# Patient Record
Sex: Female | Born: 1935 | Race: Black or African American | Hispanic: No | Smoking: Former smoker
Health system: Southern US, Community
[De-identification: ages and names within clinical notes are randomized; demographics above are authoritative.]

## PROBLEM LIST (undated history)

## (undated) DIAGNOSIS — R200 Anesthesia of skin: Secondary | ICD-10-CM

## (undated) DIAGNOSIS — S5291XA Unspecified fracture of right forearm, initial encounter for closed fracture: Secondary | ICD-10-CM

## (undated) DIAGNOSIS — F32A Depression, unspecified: Secondary | ICD-10-CM

## (undated) DIAGNOSIS — K811 Chronic cholecystitis: Secondary | ICD-10-CM

## (undated) DIAGNOSIS — J449 Chronic obstructive pulmonary disease, unspecified: Secondary | ICD-10-CM

## (undated) DIAGNOSIS — K7581 Nonalcoholic steatohepatitis (NASH): Secondary | ICD-10-CM

## (undated) DIAGNOSIS — D509 Iron deficiency anemia, unspecified: Secondary | ICD-10-CM

## (undated) DIAGNOSIS — N2889 Other specified disorders of kidney and ureter: Secondary | ICD-10-CM

## (undated) DIAGNOSIS — I10 Essential (primary) hypertension: Secondary | ICD-10-CM

## (undated) DIAGNOSIS — C73 Malignant neoplasm of thyroid gland: Secondary | ICD-10-CM

## (undated) DIAGNOSIS — F329 Major depressive disorder, single episode, unspecified: Secondary | ICD-10-CM

## (undated) DIAGNOSIS — R300 Dysuria: Secondary | ICD-10-CM

## (undated) DIAGNOSIS — E785 Hyperlipidemia, unspecified: Secondary | ICD-10-CM

## (undated) DIAGNOSIS — E039 Hypothyroidism, unspecified: Secondary | ICD-10-CM

## (undated) HISTORY — DX: Unspecified fracture of right forearm, initial encounter for closed fracture: S52.91XA

## (undated) HISTORY — DX: Iron deficiency anemia, unspecified: D50.9

## (undated) HISTORY — DX: Dysuria: R30.0

## (undated) HISTORY — PX: THYROIDECTOMY: SHX17

## (undated) HISTORY — DX: Nonalcoholic steatohepatitis (NASH): K75.81

## (undated) HISTORY — DX: Other specified disorders of kidney and ureter: N28.89

## (undated) HISTORY — DX: Essential (primary) hypertension: I10

## (undated) HISTORY — PX: EYE SURGERY: SHX253

## (undated) HISTORY — DX: Major depressive disorder, single episode, unspecified: F32.9

## (undated) HISTORY — DX: Chronic cholecystitis: K81.1

## (undated) HISTORY — PX: OTHER SURGICAL HISTORY: SHX169

## (undated) HISTORY — DX: Depression, unspecified: F32.A

## (undated) HISTORY — DX: Chronic obstructive pulmonary disease, unspecified: J44.9

## (undated) HISTORY — DX: Hyperlipidemia, unspecified: E78.5

## (undated) HISTORY — PX: VESICOVAGINAL FISTULA CLOSURE W/ TAH: SUR271

## (undated) HISTORY — DX: Hypothyroidism, unspecified: E03.9

## (undated) HISTORY — PX: BACK SURGERY: SHX140

## (undated) HISTORY — DX: Anesthesia of skin: R20.0

## (undated) HISTORY — PX: SPINE SURGERY: SHX786

## (undated) HISTORY — DX: Malignant neoplasm of thyroid gland: C73

---

## 2001-03-23 ENCOUNTER — Other Ambulatory Visit: Admission: RE | Admit: 2001-03-23 | Discharge: 2001-03-23 | Payer: Self-pay | Admitting: Family Medicine

## 2001-08-24 ENCOUNTER — Encounter: Payer: Self-pay | Admitting: Family Medicine

## 2001-08-24 ENCOUNTER — Ambulatory Visit (HOSPITAL_COMMUNITY): Admission: RE | Admit: 2001-08-24 | Discharge: 2001-08-24 | Payer: Self-pay | Admitting: Family Medicine

## 2002-02-15 ENCOUNTER — Encounter: Payer: Self-pay | Admitting: Preventative Medicine

## 2002-02-15 ENCOUNTER — Ambulatory Visit (HOSPITAL_COMMUNITY): Admission: RE | Admit: 2002-02-15 | Discharge: 2002-02-15 | Payer: Self-pay | Admitting: Preventative Medicine

## 2004-02-29 ENCOUNTER — Ambulatory Visit (HOSPITAL_COMMUNITY): Admission: RE | Admit: 2004-02-29 | Discharge: 2004-02-29 | Payer: Self-pay | Admitting: *Deleted

## 2004-07-26 ENCOUNTER — Ambulatory Visit (HOSPITAL_COMMUNITY): Admission: RE | Admit: 2004-07-26 | Discharge: 2004-07-26 | Payer: Self-pay | Admitting: Family Medicine

## 2004-08-08 ENCOUNTER — Ambulatory Visit (HOSPITAL_COMMUNITY): Admission: RE | Admit: 2004-08-08 | Discharge: 2004-08-08 | Payer: Self-pay | Admitting: Family Medicine

## 2004-08-13 ENCOUNTER — Ambulatory Visit (HOSPITAL_COMMUNITY): Admission: RE | Admit: 2004-08-13 | Discharge: 2004-08-13 | Payer: Self-pay | Admitting: Family Medicine

## 2004-09-27 ENCOUNTER — Ambulatory Visit: Payer: Self-pay | Admitting: Family Medicine

## 2004-11-27 ENCOUNTER — Ambulatory Visit: Payer: Self-pay | Admitting: Family Medicine

## 2005-01-08 ENCOUNTER — Ambulatory Visit: Payer: Self-pay | Admitting: Family Medicine

## 2005-04-08 ENCOUNTER — Ambulatory Visit: Payer: Self-pay | Admitting: Family Medicine

## 2005-04-10 ENCOUNTER — Ambulatory Visit (HOSPITAL_COMMUNITY): Admission: RE | Admit: 2005-04-10 | Discharge: 2005-04-10 | Payer: Self-pay | Admitting: Family Medicine

## 2005-07-15 ENCOUNTER — Ambulatory Visit: Payer: Self-pay | Admitting: Family Medicine

## 2005-07-28 ENCOUNTER — Ambulatory Visit (HOSPITAL_COMMUNITY): Admission: RE | Admit: 2005-07-28 | Discharge: 2005-07-28 | Payer: Self-pay | Admitting: Family Medicine

## 2005-08-20 ENCOUNTER — Ambulatory Visit (HOSPITAL_COMMUNITY): Admission: RE | Admit: 2005-08-20 | Discharge: 2005-08-20 | Payer: Self-pay | Admitting: Family Medicine

## 2005-08-20 LAB — HM MAMMOGRAPHY: HM Mammogram: NORMAL

## 2005-09-16 ENCOUNTER — Ambulatory Visit: Payer: Self-pay | Admitting: Family Medicine

## 2005-09-16 LAB — CONVERTED CEMR LAB: Pap Smear: NORMAL

## 2005-11-19 ENCOUNTER — Ambulatory Visit: Payer: Self-pay | Admitting: Family Medicine

## 2006-03-19 ENCOUNTER — Ambulatory Visit: Payer: Self-pay | Admitting: Family Medicine

## 2006-07-24 ENCOUNTER — Ambulatory Visit: Payer: Self-pay | Admitting: Family Medicine

## 2006-08-25 ENCOUNTER — Ambulatory Visit: Payer: Self-pay | Admitting: Family Medicine

## 2006-11-27 ENCOUNTER — Ambulatory Visit: Payer: Self-pay | Admitting: Family Medicine

## 2006-11-27 LAB — CONVERTED CEMR LAB
Albumin: 4.4 g/dL (ref 3.5–5.2)
Alkaline Phosphatase: 172 units/L — ABNORMAL HIGH (ref 39–117)
Bilirubin, Direct: 0.1 mg/dL (ref 0.0–0.3)
CO2: 26 meq/L (ref 19–32)
Chloride: 103 meq/L (ref 96–112)
Creatinine, Ser: 1.06 mg/dL (ref 0.40–1.20)
Glucose, Bld: 91 mg/dL (ref 70–99)
HDL: 82 mg/dL (ref >39)
LDL Cholesterol: 140 mg/dL — ABNORMAL HIGH (ref 0–99)
TSH: 0.338 microintl units/mL — ABNORMAL LOW (ref 0.350–5.50)
Total Bilirubin: 0.5 mg/dL (ref 0.3–1.2)
Total CHOL/HDL Ratio: 3

## 2007-03-10 ENCOUNTER — Emergency Department (HOSPITAL_COMMUNITY): Admission: EM | Admit: 2007-03-10 | Discharge: 2007-03-10 | Payer: Self-pay | Admitting: Emergency Medicine

## 2007-03-10 ENCOUNTER — Ambulatory Visit: Payer: Self-pay | Admitting: Family Medicine

## 2007-03-17 ENCOUNTER — Ambulatory Visit: Payer: Self-pay | Admitting: Family Medicine

## 2007-03-17 LAB — CONVERTED CEMR LAB
HDL: 60 mg/dL (ref >39)
TSH: 0.697 microintl units/mL (ref 0.350–5.50)
Triglycerides: 98 mg/dL (ref <150)

## 2007-06-03 ENCOUNTER — Ambulatory Visit: Payer: Self-pay | Admitting: Family Medicine

## 2007-06-03 ENCOUNTER — Ambulatory Visit (HOSPITAL_COMMUNITY): Admission: RE | Admit: 2007-06-03 | Discharge: 2007-06-03 | Payer: Self-pay | Admitting: Family Medicine

## 2007-06-03 LAB — CONVERTED CEMR LAB
Leukocytes, UA: NEGATIVE
Nitrite: NEGATIVE
Specific Gravity, Urine: 1.025
Urobilinogen, UA: 0.2
pH: 5.5

## 2007-06-04 ENCOUNTER — Encounter: Payer: Self-pay | Admitting: Family Medicine

## 2007-06-04 LAB — CONVERTED CEMR LAB
Leukocytes, UA: NEGATIVE
Nitrite: NEGATIVE
Protein, ur: NEGATIVE mg/dL
Urine Glucose: NEGATIVE mg/dL
Urobilinogen, UA: 0.2 (ref 0.0–1.0)

## 2007-06-10 ENCOUNTER — Ambulatory Visit: Payer: Self-pay | Admitting: Orthopedic Surgery

## 2007-07-12 ENCOUNTER — Ambulatory Visit: Payer: Self-pay | Admitting: Orthopedic Surgery

## 2007-09-17 ENCOUNTER — Ambulatory Visit: Payer: Self-pay | Admitting: Family Medicine

## 2007-11-26 ENCOUNTER — Encounter: Payer: Self-pay | Admitting: Family Medicine

## 2007-11-26 DIAGNOSIS — K811 Chronic cholecystitis: Secondary | ICD-10-CM

## 2007-11-26 DIAGNOSIS — Z87891 Personal history of nicotine dependence: Secondary | ICD-10-CM | POA: Insufficient documentation

## 2007-11-26 DIAGNOSIS — E785 Hyperlipidemia, unspecified: Secondary | ICD-10-CM | POA: Insufficient documentation

## 2007-11-26 DIAGNOSIS — D509 Iron deficiency anemia, unspecified: Secondary | ICD-10-CM

## 2007-11-26 DIAGNOSIS — S42309A Unspecified fracture of shaft of humerus, unspecified arm, initial encounter for closed fracture: Secondary | ICD-10-CM | POA: Insufficient documentation

## 2007-11-26 DIAGNOSIS — E039 Hypothyroidism, unspecified: Secondary | ICD-10-CM

## 2007-11-26 DIAGNOSIS — D376 Neoplasm of uncertain behavior of liver, gallbladder and bile ducts: Secondary | ICD-10-CM | POA: Insufficient documentation

## 2007-11-26 DIAGNOSIS — K7689 Other specified diseases of liver: Secondary | ICD-10-CM | POA: Insufficient documentation

## 2007-11-26 DIAGNOSIS — I10 Essential (primary) hypertension: Secondary | ICD-10-CM

## 2008-02-15 ENCOUNTER — Ambulatory Visit: Payer: Self-pay | Admitting: Family Medicine

## 2008-02-15 LAB — CONVERTED CEMR LAB
Albumin: 4.8 g/dL (ref 3.5–5.2)
Alkaline Phosphatase: 181 units/L — ABNORMAL HIGH (ref 39–117)
BUN: 15 mg/dL (ref 6–23)
CO2: 25 meq/L (ref 19–32)
Chloride: 105 meq/L (ref 96–112)
Creatinine, Ser: 0.91 mg/dL (ref 0.40–1.20)
HDL: 85 mg/dL (ref >39)
LDL Cholesterol: 123 mg/dL — ABNORMAL HIGH (ref 0–99)
Potassium: 4.1 meq/L (ref 3.5–5.3)
Total Bilirubin: 0.4 mg/dL (ref 0.3–1.2)
Total Protein: 7.8 g/dL (ref 6.0–8.3)
VLDL: 28 mg/dL (ref 0–40)

## 2008-02-16 ENCOUNTER — Encounter: Payer: Self-pay | Admitting: Family Medicine

## 2008-02-16 LAB — CONVERTED CEMR LAB
Hep B C IgM: NEGATIVE
Hepatitis B Surface Ag: NEGATIVE

## 2008-06-28 ENCOUNTER — Ambulatory Visit: Payer: Self-pay | Admitting: Family Medicine

## 2008-06-28 LAB — CONVERTED CEMR LAB
ALT: 42 units/L — ABNORMAL HIGH (ref 0–35)
BUN: 23 mg/dL (ref 6–23)
CO2: 24 meq/L (ref 19–32)
Chloride: 106 meq/L (ref 96–112)
Creatinine, Ser: 1.04 mg/dL (ref 0.40–1.20)
Glucose, Bld: 95 mg/dL (ref 70–99)
Hemoglobin: 12 g/dL (ref 12.0–15.0)
Lymphocytes Relative: 24 % (ref 12–46)
MCHC: 32.2 g/dL (ref 30.0–36.0)
Monocytes Absolute: 0.3 10*3/uL (ref 0.1–1.0)
Monocytes Relative: 8 % (ref 3–12)
Neutro Abs: 2.5 10*3/uL (ref 1.7–7.7)
RBC: 3.7 M/uL — ABNORMAL LOW (ref 3.87–5.11)
TSH: 0.577 microintl units/mL (ref 0.350–4.50)
Total Protein: 7.7 g/dL (ref 6.0–8.3)

## 2008-06-29 DIAGNOSIS — G4700 Insomnia, unspecified: Secondary | ICD-10-CM | POA: Insufficient documentation

## 2008-08-21 ENCOUNTER — Telehealth: Payer: Self-pay | Admitting: Family Medicine

## 2008-08-21 ENCOUNTER — Encounter: Payer: Self-pay | Admitting: Family Medicine

## 2008-09-14 ENCOUNTER — Encounter: Payer: Self-pay | Admitting: Family Medicine

## 2008-09-21 ENCOUNTER — Encounter: Payer: Self-pay | Admitting: Family Medicine

## 2008-10-31 ENCOUNTER — Ambulatory Visit: Payer: Self-pay | Admitting: Family Medicine

## 2008-11-02 LAB — CONVERTED CEMR LAB
AST: 33 units/L (ref 0–37)
Alkaline Phosphatase: 312 units/L — ABNORMAL HIGH (ref 39–117)
Bilirubin, Direct: 0.1 mg/dL (ref 0.0–0.3)
CO2: 21 meq/L (ref 19–32)
Calcium: 11.4 mg/dL — ABNORMAL HIGH (ref 8.4–10.5)
Creatinine, Ser: 1.04 mg/dL (ref 0.40–1.20)
Glucose, Bld: 103 mg/dL — ABNORMAL HIGH (ref 70–99)
HDL: 76 mg/dL (ref >39)
Indirect Bilirubin: 0.4 mg/dL (ref 0.0–0.9)
Potassium: 4.5 meq/L (ref 3.5–5.3)
Sodium: 142 meq/L (ref 135–145)
TSH: 1.435 microintl units/mL (ref 0.350–4.50)
Total Bilirubin: 0.5 mg/dL (ref 0.3–1.2)

## 2008-12-11 ENCOUNTER — Telehealth: Payer: Self-pay | Admitting: Family Medicine

## 2008-12-19 ENCOUNTER — Telehealth: Payer: Self-pay | Admitting: Family Medicine

## 2008-12-20 ENCOUNTER — Encounter: Payer: Self-pay | Admitting: Family Medicine

## 2009-02-18 ENCOUNTER — Emergency Department (HOSPITAL_COMMUNITY): Admission: EM | Admit: 2009-02-18 | Discharge: 2009-02-18 | Payer: Self-pay | Admitting: Emergency Medicine

## 2009-02-19 ENCOUNTER — Encounter: Payer: Self-pay | Admitting: Family Medicine

## 2009-02-19 ENCOUNTER — Telehealth: Payer: Self-pay | Admitting: Family Medicine

## 2009-02-28 ENCOUNTER — Encounter: Payer: Self-pay | Admitting: Family Medicine

## 2009-03-08 ENCOUNTER — Ambulatory Visit: Payer: Self-pay | Admitting: Family Medicine

## 2009-03-08 DIAGNOSIS — M79609 Pain in unspecified limb: Secondary | ICD-10-CM | POA: Insufficient documentation

## 2009-03-08 DIAGNOSIS — M542 Cervicalgia: Secondary | ICD-10-CM

## 2009-03-08 LAB — CONVERTED CEMR LAB
CO2: 26 meq/L (ref 19–32)
Chloride: 103 meq/L (ref 96–112)
Cholesterol: 291 mg/dL — ABNORMAL HIGH (ref 0–200)
Glucose, Bld: 94 mg/dL (ref 70–99)
LDL Cholesterol: 181 mg/dL — ABNORMAL HIGH (ref 0–99)
Potassium: 4.1 meq/L (ref 3.5–5.3)
Sodium: 140 meq/L (ref 135–145)
TSH: 1.334 microintl units/mL (ref 0.350–4.500)
Total CHOL/HDL Ratio: 4.2
VLDL: 40 mg/dL (ref 0–40)

## 2009-03-14 ENCOUNTER — Telehealth: Payer: Self-pay | Admitting: Family Medicine

## 2009-04-09 ENCOUNTER — Telehealth: Payer: Self-pay | Admitting: Family Medicine

## 2009-04-17 ENCOUNTER — Telehealth: Payer: Self-pay | Admitting: Family Medicine

## 2009-04-27 ENCOUNTER — Telehealth: Payer: Self-pay | Admitting: Family Medicine

## 2009-04-30 ENCOUNTER — Encounter: Payer: Self-pay | Admitting: Family Medicine

## 2009-06-18 ENCOUNTER — Telehealth: Payer: Self-pay | Admitting: Family Medicine

## 2009-07-09 ENCOUNTER — Ambulatory Visit: Payer: Self-pay | Admitting: Family Medicine

## 2009-07-10 DIAGNOSIS — H6120 Impacted cerumen, unspecified ear: Secondary | ICD-10-CM

## 2009-07-24 ENCOUNTER — Telehealth: Payer: Self-pay | Admitting: Family Medicine

## 2009-07-31 ENCOUNTER — Telehealth: Payer: Self-pay | Admitting: Family Medicine

## 2009-08-16 ENCOUNTER — Encounter: Payer: Self-pay | Admitting: Family Medicine

## 2009-10-31 ENCOUNTER — Ambulatory Visit: Payer: Self-pay | Admitting: Family Medicine

## 2009-11-02 LAB — CONVERTED CEMR LAB
Alkaline Phosphatase: 132 units/L — ABNORMAL HIGH (ref 39–117)
Basophils Relative: 1 % (ref 0–1)
CO2: 25 meq/L (ref 19–32)
Calcium: 11.1 mg/dL — ABNORMAL HIGH (ref 8.4–10.5)
Cholesterol: 310 mg/dL — ABNORMAL HIGH (ref 0–200)
Creatinine, Ser: 0.95 mg/dL (ref 0.40–1.20)
HDL: 79 mg/dL (ref >39)
Hemoglobin: 13.3 g/dL (ref 12.0–15.0)
Indirect Bilirubin: 0.4 mg/dL (ref 0.0–0.9)
LDL Cholesterol: 202 mg/dL — ABNORMAL HIGH (ref 0–99)
Lymphs Abs: 1 10*3/uL (ref 0.7–4.0)
MCHC: 32.8 g/dL (ref 30.0–36.0)
MCV: 98.3 fL (ref 78.0–100.0)
Monocytes Relative: 8 % (ref 3–12)
RBC: 4.13 M/uL (ref 3.87–5.11)
RDW: 12.9 % (ref 11.5–15.5)
Sodium: 141 meq/L (ref 135–145)
Total Protein: 8 g/dL (ref 6.0–8.3)
Triglycerides: 146 mg/dL (ref <150)

## 2009-11-14 ENCOUNTER — Telehealth: Payer: Self-pay | Admitting: Family Medicine

## 2009-12-17 ENCOUNTER — Telehealth: Payer: Self-pay | Admitting: Family Medicine

## 2010-02-11 ENCOUNTER — Telehealth: Payer: Self-pay | Admitting: Family Medicine

## 2010-03-01 ENCOUNTER — Ambulatory Visit: Payer: Self-pay | Admitting: Family Medicine

## 2010-03-01 DIAGNOSIS — M109 Gout, unspecified: Secondary | ICD-10-CM

## 2010-03-01 DIAGNOSIS — K3189 Other diseases of stomach and duodenum: Secondary | ICD-10-CM

## 2010-03-01 DIAGNOSIS — M25579 Pain in unspecified ankle and joints of unspecified foot: Secondary | ICD-10-CM

## 2010-03-01 DIAGNOSIS — R1013 Epigastric pain: Secondary | ICD-10-CM

## 2010-03-05 LAB — CONVERTED CEMR LAB
ALT: 32 units/L (ref 0–35)
AST: 38 units/L — ABNORMAL HIGH (ref 0–37)
Albumin: 4.8 g/dL (ref 3.5–5.2)
Alkaline Phosphatase: 153 units/L — ABNORMAL HIGH (ref 39–117)
CO2: 24 meq/L (ref 19–32)
Calcium: 10.5 mg/dL (ref 8.4–10.5)
HDL: 81 mg/dL (ref >39)
Sodium: 141 meq/L (ref 135–145)
TSH: 0.339 microintl units/mL — ABNORMAL LOW (ref 0.350–4.500)
Total Bilirubin: 0.6 mg/dL (ref 0.3–1.2)
Total CHOL/HDL Ratio: 3.2
Total Protein: 7.8 g/dL (ref 6.0–8.3)
Triglycerides: 93 mg/dL (ref <150)
Uric Acid, Serum: 7.5 mg/dL — ABNORMAL HIGH (ref 2.4–7.0)

## 2010-04-05 ENCOUNTER — Telehealth: Payer: Self-pay | Admitting: Family Medicine

## 2010-07-05 ENCOUNTER — Ambulatory Visit: Payer: Self-pay | Admitting: Family Medicine

## 2010-07-05 DIAGNOSIS — R5381 Other malaise: Secondary | ICD-10-CM

## 2010-07-05 DIAGNOSIS — R5383 Other fatigue: Secondary | ICD-10-CM | POA: Insufficient documentation

## 2010-07-05 DIAGNOSIS — R42 Dizziness and giddiness: Secondary | ICD-10-CM | POA: Insufficient documentation

## 2010-07-08 ENCOUNTER — Telehealth: Payer: Self-pay | Admitting: Family Medicine

## 2010-07-10 LAB — CONVERTED CEMR LAB
ALT: 16 units/L (ref 0–35)
Alkaline Phosphatase: 137 units/L — ABNORMAL HIGH (ref 39–117)
BUN: 23 mg/dL (ref 6–23)
Basophils Relative: 1 % (ref 0–1)
Calcium: 11.1 mg/dL — ABNORMAL HIGH (ref 8.4–10.5)
Cholesterol: 238 mg/dL — ABNORMAL HIGH (ref 0–200)
Creatinine, Ser: 1.18 mg/dL (ref 0.40–1.20)
Eosinophils Absolute: 0.3 10*3/uL (ref 0.0–0.7)
HCT: 40.6 % (ref 36.0–46.0)
Hemoglobin: 13.2 g/dL (ref 12.0–15.0)
Indirect Bilirubin: 0.6 mg/dL (ref 0.0–0.9)
Lymphs Abs: 1 10*3/uL (ref 0.7–4.0)
MCHC: 32.5 g/dL (ref 30.0–36.0)
MCV: 101.2 fL — ABNORMAL HIGH (ref 78.0–100.0)
Monocytes Absolute: 0.3 10*3/uL (ref 0.1–1.0)
Monocytes Relative: 8 % (ref 3–12)
Neutrophils Relative %: 52 % (ref 43–77)
Potassium: 4 meq/L (ref 3.5–5.3)
RBC: 4.01 M/uL (ref 3.87–5.11)
Total Protein: 7.6 g/dL (ref 6.0–8.3)
Triglycerides: 87 mg/dL (ref <150)
WBC: 3.1 10*3/uL — ABNORMAL LOW (ref 4.0–10.5)

## 2010-09-16 ENCOUNTER — Telehealth: Payer: Self-pay | Admitting: Family Medicine

## 2010-09-18 ENCOUNTER — Encounter: Payer: Self-pay | Admitting: Family Medicine

## 2010-10-04 ENCOUNTER — Telehealth: Payer: Self-pay | Admitting: Family Medicine

## 2010-10-07 ENCOUNTER — Telehealth: Payer: Self-pay | Admitting: Family Medicine

## 2010-10-08 ENCOUNTER — Telehealth: Payer: Self-pay | Admitting: Family Medicine

## 2010-10-09 ENCOUNTER — Ambulatory Visit (HOSPITAL_COMMUNITY)
Admission: RE | Admit: 2010-10-09 | Discharge: 2010-10-09 | Payer: Self-pay | Source: Home / Self Care | Admitting: Ophthalmology

## 2010-10-18 ENCOUNTER — Ambulatory Visit: Payer: Self-pay | Admitting: Family Medicine

## 2010-10-18 DIAGNOSIS — N3000 Acute cystitis without hematuria: Secondary | ICD-10-CM | POA: Insufficient documentation

## 2010-10-19 LAB — CONVERTED CEMR LAB: TSH: 1.053 microintl units/mL (ref 0.350–4.500)

## 2010-11-28 ENCOUNTER — Encounter: Payer: Self-pay | Admitting: Family Medicine

## 2010-12-07 ENCOUNTER — Encounter: Payer: Self-pay | Admitting: Family Medicine

## 2010-12-08 ENCOUNTER — Encounter: Payer: Self-pay | Admitting: *Deleted

## 2010-12-09 ENCOUNTER — Ambulatory Visit (HOSPITAL_COMMUNITY): Admission: RE | Admit: 2010-12-09 | Payer: Self-pay | Source: Home / Self Care | Admitting: Ophthalmology

## 2010-12-09 ENCOUNTER — Ambulatory Visit (HOSPITAL_COMMUNITY)
Admission: RE | Admit: 2010-12-09 | Discharge: 2010-12-09 | Payer: Self-pay | Source: Home / Self Care | Attending: Ophthalmology | Admitting: Ophthalmology

## 2010-12-17 NOTE — Letter (Signed)
Summary: FMLA PAPER  FMLA PAPER   Imported By: Lind Guest 09/18/2010 15:04:25  _____________________________________________________________________  External Attachment:    Type:   Image     Comment:   External Document

## 2010-12-17 NOTE — Assessment & Plan Note (Signed)
Summary: f up   Vital Signs:  Patient profile:   77 year old female Menstrual status:  hysterectomy Height:      65 inches Weight:      127 pounds BMI:     21.21 O2 Sat:      98 % Pulse rate:   58 / minute Pulse rhythm:   regular Resp:     16 per minute BP sitting:   130 / 74  (left arm) BP standing:   130 / 74  (left arm) Cuff size:   regular  Vitals Entered By: Everitt Amber LPN (July 05, 2010 8:40 AM) CC: Follow up chronic problems   Primary Care Provider:  Syliva Overman MD  CC:  Follow up chronic problems.  History of Present Illness: Reports  thatl she has been dpoing fairly well. She does note however, soime early satiety and she has also lost weight.  She denies any change in bowel movements. She still has had no colonscopy , and wants to still put this off. She has unfortunately not been taking the thyroid meds as prescribed, she has been taking an excessive amt, and this may well be a part of the cause of her weight loss.  Denies recent fever or chills. Denies sinus pressure, nasal congestion , ear pain or sore throat. Denies chest congestion, or cough productive of sputum. Denies chest pain, palpitations, PND, orthopnea or leg swelling. Denies abdominal pain, nausea, vomitting, diarrhea or constipation.  Denies dysuria , frequency, incontinence or hesitancy. Denies  joint pain, swelling, or reduced mobility. Denies headaches, vertigo, seizures. Denies depression, anxiety or insomnia. Denies  rash, lesions, or itch.     Current Medications (verified): 1)  Nifedipine 30 Mg  Tb24 (Nifedipine) .Marland Kitchen.. 1 By Mouth At Bedtime 2)  Enalapril Maleate 20 Mg  Tabs (Enalapril Maleate) .... 2 By Mouth Once Daily 3)  Hydrochlorothiazide 25 Mg  Tabs (Hydrochlorothiazide) .Marland Kitchen.. 1 By Mouth Once Daily 4)  Levoxyl 75 Mcg  Tabs (Levothyroxine Sodium) .Marland Kitchen.. 1 By Mouth Monday Thru Friday 1/2 Saturday and Sunday 5)  Zetia 10 Mg Tabs (Ezetimibe) .... Take 1 Tab By Mouth At  Bedtime 6)  Lovastatin 20 Mg Tabs (Lovastatin) .... One Tab By Mouth Qhs 7)  Ranitidine Hcl 150 Mg Caps (Ranitidine Hcl) .... Take 1 Capsule By Mouth Once A Day  Allergies (verified): No Known Drug Allergies  Review of Systems      See HPI General:  Denies fatigue and weakness. Neuro:  Complains of sensation of room spinning; 1 week h/o intermittent vertigo, not asociated with change ion position, first episode. Endo:  Denies cold intolerance, excessive hunger, excessive urination, and polyuria. Heme:  Denies abnormal bruising and bleeding. Allergy:  Denies hives or rash and itching eyes.  Physical Exam  General:  Well-developed,well-nourished,in no acute distress; alert,appropriate and cooperative throughout examination HEENT: No facial asymmetry,  EOMI, No sinus tenderness, TM's Clear, oropharynx  pink and moist.   Chest: Clear to auscultation bilaterally.  CVS: S1, S2, No murmurs, No S3.   Abd: Soft, Nontender.hepatomegaly, rectal vault empty.  MS: Adequate ROM spine, hips, shoulders and knees.  Ext: No edema.   CNS: CN 2-12 intact, power tone and sensation normal throughout.   Skin: Intact, no visible lesions or rashes.  Psych: Good eye contact, normal affect.  Memory intact, not anxious or depressed appearing.    Impression & Recommendations:  Problem # 1:  FATIGUE (ICD-780.79) Assessment Comment Only  Orders: T-CBC w/Diff ([address]109-60454)  Problem #  2:  DYSPEPSIA (ICD-536.8) Assessment: Deteriorated  Problem # 3:  HYPOTHYROIDISM (ICD-244.9) Assessment: Comment Only  The following medications were removed from the medication list:    Levoxyl 75 Mcg Tabs (Levothyroxine sodium) .Marland Kitchen... 1 by mouth monday thru friday 1/2 saturday and sunday Her updated medication list for this problem includes:    Levoxyl 50 Mcg Tabs (Levothyroxine sodium) .Marland Kitchen... Take 1 tablet by mouth once a day  Orders: T-TSH (33295-18841)  Labs Reviewed:, likely overcorrected TSH: 0.339  (03/01/2010)    Chol: 261 (03/01/2010)   HDL: 81 (03/01/2010)   LDL: 161 (03/01/2010)   TG: 93 (03/01/2010)  Problem # 4:  HYPERTENSION (ICD-401.9) Assessment: Unchanged  Her updated medication list for this problem includes:    Nifedipine 30 Mg Tb24 (Nifedipine) .Marland Kitchen... 1 by mouth at bedtime    Enalapril Maleate 20 Mg Tabs (Enalapril maleate) .Marland Kitchen... 2 by mouth once daily    Hydrochlorothiazide 25 Mg Tabs (Hydrochlorothiazide) .Marland Kitchen... 1 by mouth once daily  Orders: T-Basic Metabolic Panel ([id number])  BP today: 130/74 Prior BP: 122/82 (03/01/2010)  Labs Reviewed: K+: 4.4 (03/01/2010) Creat: : 1.11 (03/01/2010)   Chol: 261 (03/01/2010)   HDL: 81 (03/01/2010)   LDL: 161 (03/01/2010)   TG: 93 (03/01/2010)  Problem # 5:  HYPERLIPIDEMIA (ICD-272.4) Assessment: Comment Only  Her updated medication list for this problem includes:    Zetia 10 Mg Tabs (Ezetimibe) .Marland Kitchen... Take 1 tab by mouth at bedtime    Lovastatin 20 Mg Tabs (Lovastatin) ..... One tab by mouth qhs  Orders: T-Hepatic Function [id number]) T-Lipid Profile [id number])  Labs Reviewed: SGOT: 38 (03/01/2010)   SGPT: 32 (03/01/2010)   HDL:81 (03/01/2010), 79 (10/31/2009)  LDL:161 (03/01/2010), 202 (10/31/2009)  Chol:261 (03/01/2010), 310 (10/31/2009)  Trig:93 (03/01/2010), 146 (10/31/2009)  Problem # 6:  LIVER MASS (ICD-235.3) Assessment: Comment Only needs RUQ ultrasound if none in the past 1 yr, pt hesitant due tpo cost, but the impt of same will be stressed   Problem # 7:  VERTIGO (ICD-780.4) Assessment: New  Her updated medication list for this problem includes:    Antivert 12.5 Mg Tabs (Meclizine hcl) .Marland Kitchen... Take 1 tablet by mouth three times a day as needed for dizziness pt to call if symtpoms worsen  Complete Medication List: 1)  Nifedipine 30 Mg Tb24 (Nifedipine) .Marland Kitchen.. 1 by mouth at bedtime 2)  Enalapril Maleate 20 Mg Tabs (Enalapril maleate) .... 2 by mouth once daily 3)  Hydrochlorothiazide 25 Mg Tabs  (Hydrochlorothiazide) .Marland Kitchen.. 1 by mouth once daily 4)  Zetia 10 Mg Tabs (Ezetimibe) .... Take 1 tab by mouth at bedtime 5)  Lovastatin 20 Mg Tabs (Lovastatin) .... One tab by mouth qhs 6)  Ranitidine Hcl 150 Mg Caps (Ranitidine hcl) .... Take 1 capsule by mouth once a day 7)  Ampicillin 500 Mg Caps (Ampicillin) .... Take 1 tablet by mouth two times a day 8)  Clarithromycin 500 Mg Tabs (Clarithromycin) .... Take 1 tablet by mouth two times a day 9)  Omeprazole 40 Mg Cpdr (Omeprazole) .... Take 1 capsule by mouth once a day 10)  Antivert 12.5 Mg Tabs (Meclizine hcl) .... Take 1 tablet by mouth three times a day as needed for dizziness 11)  Levoxyl 50 Mcg Tabs (Levothyroxine sodium) .... Take 1 tablet by mouth once a day  Patient Instructions: 1)  Please schedule a follow-up appointment in 3 months. 2)  TSH prior to visit, ICD-9: 3)  BMP prior to visit, ICD-9: 4)  Hepatic Panel prior to visit, ICD-9:  today fasting 5)  Lipid Panel prior to visit, ICD-9: 6)  CBC w/ Diff prior to visit, ICD-9: 7)  You really do need to call  if you continue  to be dizzy call yoou will need a test to check for blockage in your neck. 8)  You need to have upper and lower endoscopy Prescriptions: LEVOXYL 50 MCG TABS (LEVOTHYROXINE SODIUM) Take 1 tablet by mouth once a day  #30 x 3   Entered and Authorized by:   Syliva Overman MD   Signed by:   Syliva Overman MD on 07/07/2010   Method used:   Historical   RxID:   [id number] ZETIA 10 MG TABS (EZETIMIBE) Take 1 tab by mouth at bedtime  #28 x 0   Entered by:   Everitt Amber LPN   Authorized by:   Syliva Overman MD   Signed by:   Everitt Amber LPN on 30/86/5784   Method used:   Samples Given   RxID:   [id number] ANTIVERT 12.5 MG TABS (MECLIZINE HCL) Take 1 tablet by mouth three times a day as needed for dizziness  #30 x 0   Entered and Authorized by:   Syliva Overman MD   Signed by:   Syliva Overman MD on 07/05/2010   Method used:    Electronically to        [facility] [address] [phone]* (retail)       [address] [place], [place]  [address]       Ph: [phone]       Fax: [phone]   RxID:   [id number] OMEPRAZOLE 40 MG CPDR (OMEPRAZOLE) Take 1 capsule by mouth once a day  #30 x 0   Entered and Authorized by:   Syliva Overman MD   Signed by:   Syliva Overman MD on 07/05/2010   Method used:   Electronically to        [facility] [address] [phone]* (retail)       [address] [place], [place]  [address]       Ph: [phone]       Fax: [phone]   RxID:   [id number] CLARITHROMYCIN 500 MG TABS (CLARITHROMYCIN) Take 1 tablet by mouth two times a day  #28 x 0   Entered and Authorized by:   Syliva Overman MD   Signed by:   Syliva Overman MD on 07/05/2010   Method used:   Electronically to        [facility] [address] [phone]* (retail)       [address] [place]  [address]       Ph: [phone]       Fax: [phone]   RxID:   [id number] AMPICILLIN 500 MG CAPS (AMPICILLIN) Take 1 tablet by mouth two times a day  #28 x 0   Entered and Authorized by:   Syliva Overman MD   Signed by:   Syliva Overman MD on 07/05/2010   Method used:   Electronically to        [facility] [address] [phone]* (retail)       [address] [place], [place]  [address]       Ph: [phone]       Fax: [phone]   RxID:   [id number]  cd80f 04/2011 Zetia Samples

## 2010-12-17 NOTE — Progress Notes (Signed)
Summary: medicines  Phone Note Call from Patient   Summary of Call: call her about her medicines Initial call taken by: Lind Guest,  October 08, 2010 11:45 AM

## 2010-12-17 NOTE — Assessment & Plan Note (Signed)
Summary: F UP   Vital Signs:  Patient profile:   77 year old female Menstrual status:  hysterectomy Height:      65 inches Weight:      135.25 pounds BMI:     22.59 O2 Sat:      97 % Pulse rate:   59 / minute Pulse rhythm:   regular Resp:     16 per minute BP sitting:   122 / 82  (left arm) Cuff size:   regular  Vitals Entered By: Everitt Amber LPN (March 01, 2010 9:28 AM) CC: Follow up chronic problems   Primary Care Provider:  Syliva Overman MD  CC:  Follow up chronic problems.  History of Present Illness: Reports  that she has been  doing well. she has no specific complaints or concerns Denies recent fever or chills. Denies sinus pressure, nasal congestion , ear pain or sore throat. Denies chest congestion, or cough productive of sputum. Denies chest pain, palpitations, PND, orthopnea or leg swelling. Denies abdominal pain, nausea, vomitting, diarrhea or constipation. Denies change in bowel movements or bloody stool. Denies dysuria , frequency, incontinence or hesitancy. Reports  joint pain, swelling, or reduced mobility. Denies headaches, vertigo, seizures. Denies depression, anxiety or insomnia. Denies  rash, lesions, or itch.     Current Medications (verified): 1)  Nifedipine 30 Mg  Tb24 (Nifedipine) .Marland Kitchen.. 1 By Mouth At Bedtime 2)  Enalapril Maleate 20 Mg  Tabs (Enalapril Maleate) .... 2 By Mouth Once Daily 3)  Hydrochlorothiazide 25 Mg  Tabs (Hydrochlorothiazide) .Marland Kitchen.. 1 By Mouth Once Daily 4)  Levoxyl 75 Mcg  Tabs (Levothyroxine Sodium) .Marland Kitchen.. 1 By Mouth Monday Thru Friday 1/2 Saturday and Sunday 5)  Oscal 500/200 D-3 500-200 Mg-Unit  Tabs (Calcium-Vitamin D) .... Take 1 Tablet By Mouth Three Times A Day 6)  Zetia 10 Mg Tabs (Ezetimibe) .... Take 1 Tab By Mouth At Bedtime 7)  Lovastatin 20 Mg Tabs (Lovastatin) .... One Tab By Mouth Qhs  Allergies (verified): No Known Drug Allergies  Review of Systems      See HPI Eyes:  Denies discharge and red eye. MS:   Complains of joint pain, joint swelling, and thoracic pain; 1 week h/o right ankle pain and swelling with reduced mobility, first episode, no trauma. Endo:  Denies cold intolerance, excessive hunger, excessive thirst, excessive urination, heat intolerance, polyuria, and weight change. Heme:  Denies abnormal bruising and bleeding. Allergy:  Complains of seasonal allergies.  Physical Exam  General:  Well-developed,well-nourished,in no acute distress; alert,appropriate and cooperative throughout examination HEENT: No facial asymmetry,  EOMI, No sinus tenderness, TM's Clear, oropharynx  pink and moist.   Chest: Clear to auscultation bilaterally.  CVS: S1, S2, No murmurs, No S3.   Abd: Soft, Nontender.  MS: Adequate ROM spine, hips, shoulders and knees. Right ankle swollen , tender and with reduced mobility Ext: No edema.   CNS: CN 2-12 intact, power tone and sensation normal throughout.   Skin: Intact, no visible lesions or rashes.  Psych: Good eye contact, normal affect.  Memory intact, not anxious or depressed appearing.    Impression & Recommendations:  Problem # 1:  ANKLE PAIN, RIGHT (ICD-719.47) Assessment Deteriorated  Orders: Depo- Medrol 80mg  (J1040) Ketorolac-Toradol 15mg  (A5409) Admin of Therapeutic Inj  intramuscular or subcutaneous (81191)  Problem # 2:  HYPERLIPIDEMIA (ICD-272.4) Assessment: Comment Only  Her updated medication list for this problem includes:    Zetia 10 Mg Tabs (Ezetimibe) .Marland Kitchen... Take 1 tab by mouth at bedtime  Lovastatin 20 Mg Tabs (Lovastatin) ..... One tab by mouth qhs  Labs Reviewed: SGOT: 32 (10/31/2009)   SGPT: 23 (10/31/2009)   HDL:79 (10/31/2009), 70 (03/08/2009)  LDL:202 (10/31/2009), 181 (03/08/2009)  Chol:310 (10/31/2009), 291 (03/08/2009)  Trig:146 (10/31/2009), 198 (03/08/2009)  Orders: T-Hepatic Function (54098-11914) T-Lipid Profile (78295-62130)  Problem # 3:  HYPERTENSION (ICD-401.9) Assessment: Improved  Her updated  medication list for this problem includes:    Nifedipine 30 Mg Tb24 (Nifedipine) .Marland Kitchen... 1 by mouth at bedtime    Enalapril Maleate 20 Mg Tabs (Enalapril maleate) .Marland Kitchen... 2 by mouth once daily    Hydrochlorothiazide 25 Mg Tabs (Hydrochlorothiazide) .Marland Kitchen... 1 by mouth once daily  Orders: T-Basic Metabolic Panel [id number])  BP today: 122/82 Prior BP: 130/70 (10/31/2009)  Labs Reviewed: K+: 4.2 (10/31/2009) Creat: : 0.95 (10/31/2009)   Chol: 310 (10/31/2009)   HDL: 79 (10/31/2009)   LDL: 202 (10/31/2009)   TG: 146 (10/31/2009)  Problem # 4:  HYPOTHYROIDISM (ICD-244.9) Assessment: Comment Only  Her updated medication list for this problem includes:    Levoxyl 75 Mcg Tabs (Levothyroxine sodium) .Marland Kitchen... 1 by mouth monday thru friday 1/2 saturday and sunday  Orders: T-TSH ([id number])  Labs Reviewed: TSH: 1.219 (10/31/2009)    Chol: 310 (10/31/2009)   HDL: 79 (10/31/2009)   LDL: 202 (10/31/2009)   TG: 146 (10/31/2009)  Problem # 5:  DYSPEPSIA (ICD-536.8) Assessment: Deteriorated  Orders: TLB-H. Pylori Abs(Helicobacter Pylori) (86677-HELICO)  Complete Medication List: 1)  Nifedipine 30 Mg Tb24 (Nifedipine) .... 1 by mouth at bedtime 2)  Enalapril Maleate 20 Mg Tabs (Enalapril maleate) .... 2 by mouth once daily 3)  Hydrochlorothiazide 25 Mg Tabs (Hydrochlorothiazide) .... 1 by mouth once daily 4)  Levoxyl 75 Mcg Tabs (Levothyroxine sodium) .... 1 by mouth monday thru friday 1/2 saturday and sunday 5)  Oscal 500/200 D-3 500-200 Mg-unit Tabs (Calcium-vitamin d) .... Take 1 tablet by mouth three times a day 6)  Zetia 10 Mg Tabs (Ezetimibe) .... Take 1 tab by mouth at bedtime 7)  Lovastatin 20 Mg Tabs (Lovastatin) .... One tab by mouth qhs 8)  Ranitidine Hcl 150 Mg Caps (Ranitidine hcl) .... Take 1 capsule by mouth once a day  Other Orders: T-Uric Acid (Blood) ([id number])  Patient Instructions: 1)  Please schedule a follow-up appointment in 4 months. 2)  Hepatic Panel prior to  visit, ICD-9: 3)  Lipid Panel prior to visit, ICD-9: 4)  TSH prior to visit, ICD-9 5)  Uric acid acid:  fasting today 6)  BMP prior to visit, ICD-9 7)  H pylori 8)  You will receive 2 injections today for your swollen ankle and I will send in some meds for 6 days  with this . 9)  I will send in med for heartburn Prescriptions: RANITIDINE HCL 150 MG CAPS (RANITIDINE HCL) Take 1 capsule by mouth once a day  #90 x 1   Entered and Authorized by:   Margaret Simpson MD   Signed by:   Margaret Simpson MD on 03/01/2010   Method used:   Electronically to        [facility] [address] #[id number]* (retail)       [address], [place]  [address]       Ph: [phone]       Fax: [phone]   RxID:   [id number]    Medication Administration  Injection # 1:    Medication: Depo- Medrol 80mg     Diagnosis: ANKLE PAIN, RIGHT (  UJW-119.14)    Route: IM    Site: RUOQ gluteus    Exp Date: 09/2010    Lot #: [id number]    Mfr: Pharmacia    Comments: 80mg  given     Patient tolerated injection without complications    Given by: Everitt Amber LPN (March 01, 2010 11:49 AM)  Injection # 2:    Medication: Ketorolac-Toradol 15mg     Diagnosis: ANKLE PAIN, RIGHT (ICD-719.47)    Route: IM    Site: LUOQ gluteus    Exp Date: 12/12    Lot #: [id number]    Mfr: [email or web address]    Comments: 60mg  given     Patient tolerated injection without complications    Given by: Everitt Amber LPN (March 01, 2010 11:49 AM)  Orders Added: 1)  Est. Patient Level IV [[address]] 2)  T-Hepatic Function [80076-22960] 3)  T-Lipid Profile [80061-22930] 4)  T-TSH [62130-86578] 5)  T-Uric Acid (Blood) [84550-23180] 6)  T-Basic Metabolic Panel [80048-22910] 7)  TLB-H. Pylori Abs(Helicobacter Pylori) [86677-HELICO] 8)  Depo- Medrol 80mg  [J1040] 9)  Ketorolac-Toradol 15mg  [J1885] 10)  Admin of Therapeutic Inj  intramuscular or subcutaneous [46962]

## 2010-12-17 NOTE — Progress Notes (Signed)
Summary: foot  Phone Note Call from Patient   Summary of Call: still having problems with her foot and ankle (right) and the medicine you gave her is not helping and she can not walk up here she said today offered her an appt with [facility] for 10:00 and wants to know will you call in something at [facility] and for her foot and ankle  Initial call taken by: Lind Guest,  Apr 05, 2010 9:49 AM  Follow-up for Phone Call        pls advise new meds are sent in, if no relief she will need to see ortho, she should call back Follow-up by: Syliva Overman MD,  Apr 05, 2010 12:31 PM  Additional Follow-up for Phone Call Additional follow up Details #1::        Patient aware Additional Follow-up by: Everitt Amber LPN,  Apr 05, 2010 1:19 PM    New/Updated Medications: IBUPROFEN 600 MG TABS (IBUPROFEN) Take 1 tablet by mouth three times a day PREDNISONE (PAK) 5 MG TABS (PREDNISONE) Use as directed Prescriptions: PREDNISONE (PAK) 5 MG TABS (PREDNISONE) Use as directed  #21 x 0   Entered and Authorized by:   Syliva Overman MD   Signed by:   Syliva Overman MD on 04/05/2010   Method used:   Electronically to        [facility] [address] [phone]* (retail)       [address] [place], [place]  [address]       Ph: [phone]       Fax: [phone]   RxID:   [id number] IBUPROFEN 600 MG TABS (IBUPROFEN) Take 1 tablet by mouth three times a day  #30 x 0   Entered and Authorized by:   Syliva Overman MD   Signed by:   Syliva Overman MD on 04/05/2010   Method used:   Electronically to        [facility] [address] [phone]* (retail)       [address] [place]  [address]       Ph: [phone]       Fax: [phone]   RxID:   [id number]

## 2010-12-17 NOTE — Progress Notes (Signed)
Summary: MEDICINE  Phone Note Call from Patient   Summary of Call: NEEDS ALL HER MEDICINES SENT TO [facility] SHE HAS NO REFILLS ON ANY THEM DID YOU GET samples  ANY OF HER CHOL MEDICINE CALL BACK AT [phone] Initial call taken by: Lind Guest,  October 07, 2010 9:14 AM    Prescriptions: OMEPRAZOLE 40 MG CPDR (OMEPRAZOLE) Take 1 capsule by mouth once a day  #90 x 0   Entered by:   Adella Hare LPN   Authorized by:   Syliva Overman MD   Signed by:   Adella Hare LPN on 84/69/6295   Method used:   Electronically to        [facility] [address] [phone]* (retail)       [address] [place], [place]  [address]       Ph: [phone]       Fax: [phone]   RxID:   [id number] LEVOXYL 50 MCG TABS (LEVOTHYROXINE SODIUM) Take 1 tablet by mouth once a day  #90 Tablet x 0   Entered by:   Adella Hare LPN   Authorized by:   Syliva Overman MD   Signed by:   Adella Hare LPN on 66/04/3015   Method used:   Electronically to        [facility] [phone]* (retail)       [address], [place]  [address]       Ph: [phone]       Fax: [phone]   RxID:   [id number] RANITIDINE HCL 150 MG CAPS (RANITIDINE HCL) Take 1 capsule by mouth once a day  #90 x 0   Entered by:   Adella Hare LPN   Authorized by:   Syliva Overman MD   Signed by:   Adella Hare LPN on 85/46/2703   Method used:   Electronically to        [facility] [address] [phone]* (retail)       [address], [place]  [address]       Ph: [phone]       Fax: [phone]   RxID:   [id number] LOVASTATIN 20 MG TABS (LOVASTATIN) one tab by mouth qhs  #90 x 0   Entered by:   Adella Hare LPN   Authorized by:   Syliva Overman MD   Signed by:   Adella Hare LPN on 43/15/4008   Method used:   Electronically to        [facility] [phone]* (retail)       [address], [place]  [address]       Ph: [phone]       Fax: [phone]   RxID:    [id number] ZETIA 10 MG TABS (EZETIMIBE) Take 1 tab by mouth at bedtime  #30 x 0   Entered by:   Adella Hare LPN   Authorized by:   Syliva Overman MD   Signed by:   Adella Hare LPN on 35/32/9924   Method used:   Electronically to        [facility] [phone]* (retail)       [address] [place]  [address]       Ph: [phone]       Fax: [phone]   RxID:  [id number] HYDROCHLOROTHIAZIDE 25 MG  TABS (HYDROCHLOROTHIAZIDE) 1 by mouth once daily  #90 x 0   Entered by:   Adella Hare LPN   Authorized by:   Syliva Overman MD   Signed by:   Adella Hare LPN on 59/56/3875   Method used:   Electronically to        [facility] [phone]* (retail)       [address], [place]  [address]       Ph: [phone]       Fax: [phone]   RxID:   [id number] ENALAPRIL MALEATE 20 MG  TABS (ENALAPRIL MALEATE) 2 by mouth once daily  #180 x 0   Entered by:   Adella Hare LPN   Authorized by:   Syliva Overman MD   Signed by:   Adella Hare LPN on 83/15/1761   Method used:   Electronically to        [facility] [phone]* (retail)       [address], [place]  [address]       Ph: [phone]       Fax: [phone]   RxID:   [id number] NIFEDIPINE 30 MG  TB24 (NIFEDIPINE) 1 by mouth at bedtime  #90 x 0   Entered by:   Adella Hare LPN   Authorized by:   Syliva Overman MD   Signed by:   Adella Hare LPN on 51/12/5850   Method used:   Electronically to        [facility] [phone]* (retail)       [address] [place]  [address]       Ph: [phone]       Fax: [phone]   RxID:   [id number]

## 2010-12-17 NOTE — Progress Notes (Signed)
Summary: SAMPLES CHOL MED  Phone Note Call from Patient   Summary of Call: WANTS SAMPLES OF HER CHOL CALL BACK AT [phone] Initial call taken by: Lind Guest,  September 16, 2010 1:52 PM  Follow-up for Phone Call        called for samples, will be available by tomorrow Follow-up by: Adella Hare LPN,  September 17, 2010 8:55 AM

## 2010-12-17 NOTE — Progress Notes (Signed)
Summary: MEDICINE  Phone Note Call from Patient   Summary of Call: SOMEONE JUST CALLED HER AND SAID GOING TO CHANGE HER THYROID MEDICINE WHEN YOU SEND IN HER MEDICINE MAKE SURE THAT IT IS FOR 90 PILLS Initial call taken by: Lind Guest,  July 08, 2010 10:28 AM  Follow-up for Phone Call        called pharmacy and changed to 90 days Follow-up by: Adella Hare LPN,  July 08, 2010 10:33 AM

## 2010-12-17 NOTE — Progress Notes (Signed)
Summary: samples  Phone Note Call from Patient   Summary of Call: would you please get her samples  for her chol  Initial call taken by: Lind Guest,  October 04, 2010 12:48 PM  Follow-up for Phone Call        will call patient when available Follow-up by: Adella Hare LPN,  October 04, 2010 1:06 PM

## 2010-12-17 NOTE — Progress Notes (Signed)
Summary: samples  Phone Note Call from Patient   Summary of Call: pt would like samples of chol. meds. [phone] Initial call taken by: Rudene Anda,  December 17, 2009 9:46 AM  Follow-up for Phone Call        called for samples, patient aware Follow-up by: Worthy Keeler LPN,  December 17, 2009 10:20 AM

## 2010-12-17 NOTE — Miscellaneous (Signed)
Summary: RELEASE OF MEDICAL INFO  RELEASE OF MEDICAL INFO   Imported By: Lind Guest 03/01/2010 08:50:49  _____________________________________________________________________  External Attachment:    Type:   Image     Comment:   External Document

## 2010-12-17 NOTE — Progress Notes (Signed)
Summary: refill  Phone Note Call from Patient   Summary of Call: pt has no more choles. meds and needs samples and from drug store. [phone] Initial call taken by: Rudene Anda,  February 11, 2010 9:32 AM  Follow-up for Phone Call        Advise pt I refilled chol med x 1 mos.  No samples avail.  She is overdue for blood work though.  Needs this done in the next couple of wks.  Order Lipids & Liver panel. Dx: Hyperlipidemia Follow-up by: Esperanza Sheets PA,  February 11, 2010 2:15 PM  Additional Follow-up for Phone Call Additional follow up Details #1::        Patient aware Additional Follow-up by: Everitt Amber LPN,  February 12, 2010 3:53 PM    Prescriptions: LOVASTATIN 20 MG TABS (LOVASTATIN) one tab by mouth qhs  #30 x 0   Entered and Authorized by:   Esperanza Sheets PA   Signed by:   Esperanza Sheets PA on 02/11/2010   Method used:   Electronically to        [facility] [phone]* (retail)       [address] [place]  [address]       Ph: [phone]       Fax: [phone]   RxID:   [id number]

## 2010-12-17 NOTE — Assessment & Plan Note (Signed)
Summary: F UP   Vital Signs:  Patient profile:   77 year old female Menstrual status:  hysterectomy Height:      65 inches Weight:      132.50 pounds BMI:     22.13 O2 Sat:      98 % on Room air Pulse rate:   73 / minute Pulse rhythm:   regular Resp:     16 per minute BP sitting:   110 / 62  (left arm)  Vitals Entered By: Adella Hare LPN (October 18, 2010 9:21 AM)  O2 Flow:  Room air CC: follow-up visit Is Patient Diabetic? No Pain Assessment Patient in pain? no        Primary Care Provider:  Syliva Overman MD  CC:  follow-up visit.  History of Present Illness: Reports  that she has not been doing very well. her teeth are shaking and loose , the incisors, she reports gum pain, and states she absolutely needs to see a dentist asap, since they need extraction. She recently had right cataract extraction, and is doing well. Denies recent fever or chills. Denies sinus pressure, nasal congestion , ear pain or sore throat. Denies chest congestion, or cough productive of sputum. Denies chest pain, palpitations, PND, orthopnea or leg swelling. Denies abdominal pain, nausea, vomitting, diarrhea or constipation. Denies change in bowel movements or bloody stool.  Denies headaches, vertigo, seizures. Denies depression, anxiety or insomnia. Denies  rash, lesions, or itch.     Current Medications (verified): 1)  Nifedipine 30 Mg  Tb24 (Nifedipine) .Marland Kitchen.. 1 By Mouth At Bedtime 2)  Enalapril Maleate 20 Mg  Tabs (Enalapril Maleate) .... 2 By Mouth Once Daily 3)  Hydrochlorothiazide 25 Mg  Tabs (Hydrochlorothiazide) .Marland Kitchen.. 1 By Mouth Once Daily 4)  Zetia 10 Mg Tabs (Ezetimibe) .... Take 1 Tab By Mouth At Bedtime 5)  Ranitidine Hcl 150 Mg Caps (Ranitidine Hcl) .... Take 1 Capsule By Mouth Once A Day 6)  Omeprazole 40 Mg Cpdr (Omeprazole) .... Take 1 Capsule By Mouth Once A Day 7)  Levoxyl 50 Mcg Tabs (Levothyroxine Sodium) .... Take 1 Tablet By Mouth Once A Day  Allergies  (verified): No Known Drug Allergies  Past History:  Past medical history reviewed for relevance to current acute and chronic problems.  Past Medical History: Reviewed history from 11/26/2007 and no changes required. Allergic rhinitis Depression Hyperlipidemia Hypertension, stage 2 tobacco abuse, hx-quitt 2005 foot numbnees, bilat cholecystitis, chronic fraacture, right forearm dysuria, hx Anemia-iron deficiency Hypothyroidism hepatic mass renal mass NASH exertional dyspnea, hx COPD  Past Surgical History: Hysterectomy Thyroidectomy back surgery x2 right Cataract extraction Nov 2011  Review of Systems      See HPI General:  Complains of fatigue. Eyes:  Complains of vision loss-both eyes; right cataract exrtraction , 10/09/2010, left to be done. ENT:  teeth loose and hurting. GU:  Complains of dysuria and urinary frequency; 1 week. Endo:  Denies excessive thirst and excessive urination. Heme:  Denies abnormal bruising and bleeding. Allergy:  Denies hives or rash and itching eyes.  Physical Exam  General:  Well-developed,well-nourished,in no acute distress; alert,appropriate and cooperative throughout examination. pt appears somewhat anxious, and is concerned about her dental issues, as well as urinary symptoms HEENT: No facial asymmetry,  EOMI, No sinus tenderness, TM's Clear, oropharynx  pink and moist. incisors are loose , and gum is red  Chest: Clear to auscultation bilaterally.  CVS: S1, S2, No murmurs, No S3.   Abd: Soft, Nontender.hepatomegaly, rectal vault  empty.  MS: Adequate ROM spine, hips, shoulders and knees.  Ext: No edema.   CNS: CN 2-12 intact, power tone and sensation normal throughout.   Skin: Intact, no visible lesions or rashes.  Psych: Good eye contact, normal affect.  Memory intact, not anxious or depressed appearing.    Impression & Recommendations:  Problem # 1:  ACUTE CYSTITIS (ICD-595.0) Assessment Comment Only  The following  medications were removed from the medication list:    Ampicillin 500 Mg Caps (Ampicillin) .Marland Kitchen... Take 1 tablet by mouth two times a day    Clarithromycin 500 Mg Tabs (Clarithromycin) .Marland Kitchen... Take 1 tablet by mouth two times a day Her updated medication list for this problem includes:    Penicillin V Potassium 500 Mg Tabs (Penicillin v potassium) .Marland Kitchen... Take 1 tablet by mouth three times a day  Orders: Medicare Electronic Prescription [phone])  Encouraged to push clear liquids, get enough rest, and take acetaminophen as needed. To be seen in 10 days if no improvement, sooner if worse.  Problem # 2:  HYPERTENSION (ICD-401.9) Assessment: Improved  Her updated medication list for this problem includes:    Nifedipine 30 Mg Tb24 (Nifedipine) .Marland Kitchen... 1 by mouth at bedtime    Enalapril Maleate 20 Mg Tabs (Enalapril maleate) .Marland Kitchen... 2 by mouth once daily    Hydrochlorothiazide 25 Mg Tabs (Hydrochlorothiazide) .Marland Kitchen... 1 by mouth once daily  BP today: 110/62 Prior BP: 130/74 (07/05/2010)  Labs Reviewed: K+: 4.0 (07/05/2010) Creat: : 1.18 (07/05/2010)   Chol: 238 (07/05/2010)   HDL: 63 (07/05/2010)   LDL: 158 (07/05/2010)   TG: 87 (07/05/2010)  Problem # 3:  HYPERLIPIDEMIA (ICD-272.4) Assessment: Comment Only  Her updated medication list for this problem includes:    Zetia 10 Mg Tabs (Ezetimibe) .Marland Kitchen... Take 1 tab by mouth at bedtime Low fat dietdiscussed and encouraged  Labs Reviewed: SGOT: 22 (07/05/2010)   SGPT: 16 (07/05/2010)   HDL:63 (07/05/2010), 81 (03/01/2010)  LDL:158 (07/05/2010), 161 (03/01/2010)  Chol:238 (07/05/2010), 261 (03/01/2010)  Trig:87 (07/05/2010), 93 (03/01/2010)  Problem # 4:  HYPOTHYROIDISM (ICD-244.9) Assessment: Comment Only  Her updated medication list for this problem includes:    Levoxyl 50 Mcg Tabs (Levothyroxine sodium) .Marland Kitchen... Take 1 tablet by mouth once a day  Orders: T-TSH ([id number])  Labs Reviewed: TSH: 0.137 (07/05/2010)    Chol: 238 (07/05/2010)   HDL:  63 (07/05/2010)   LDL: 158 (07/05/2010)   TG: 87 (07/05/2010)  Complete Medication List: 1)  Nifedipine 30 Mg Tb24 (Nifedipine) .Marland Kitchen.. 1 by mouth at bedtime 2)  Enalapril Maleate 20 Mg Tabs (Enalapril maleate) .... 2 by mouth once daily 3)  Hydrochlorothiazide 25 Mg Tabs (Hydrochlorothiazide) .Marland Kitchen.. 1 by mouth once daily 4)  Zetia 10 Mg Tabs (Ezetimibe) .... Take 1 tab by mouth at bedtime 5)  Ranitidine Hcl 150 Mg Caps (Ranitidine hcl) .... Take 1 capsule by mouth once a day 6)  Omeprazole 40 Mg Cpdr (Omeprazole) .... Take 1 capsule by mouth once a day 7)  Levoxyl 50 Mcg Tabs (Levothyroxine sodium) .... Take 1 tablet by mouth once a day 8)  Penicillin V Potassium 500 Mg Tabs (Penicillin v potassium) .... Take 1 tablet by mouth three times a day  Patient Instructions: 1)  Please schedule a follow-up appointment in 3.5 months. 2)  TSH prior to visit, ICD-9:  today 3)  Med is sent in for possible UTI and gum infection. 4)  Next week if you still have urine probs, -pls plan to provide a specimen Prescriptions:  PENICILLIN V POTASSIUM 500 MG TABS (PENICILLIN V POTASSIUM) Take 1 tablet by mouth three times a day  #21 x 0   Entered and Authorized by:   Syliva Overman MD   Signed by:   Syliva Overman MD on 10/18/2010   Method used:   Electronically to        [facility] [address] [phone]* (retail)       [address] [place], [place]  [address]       Ph: [phone]       Fax: [phone]   RxID:   [id number]    Orders Added: 1)  Est. Patient Level IV [44010] 2)  Medicare Electronic Prescription [G8553] 3)  T-TSH [phone]

## 2010-12-19 NOTE — Miscellaneous (Signed)
Summary: samples  Clinical Lists Changes  Medications: Changed medication from ZETIA 10 MG TABS (EZETIMIBE) Take 1 tab by mouth at bedtime to ZETIA 10 MG TABS (EZETIMIBE) Take 1 tab by mouth at bedtime - Signed Rx of ZETIA 10 MG TABS (EZETIMIBE) Take 1 tab by mouth at bedtime;  #9boxes x 0;  Signed;  Entered by: Everitt Amber LPN;  Authorized by: Syliva Overman MD;  Method used: Samples Given    Prescriptions: ZETIA 10 MG TABS (EZETIMIBE) Take 1 tab by mouth at bedtime  #9boxes x 0   Entered by:   Everitt Amber LPN   Authorized by:   Syliva Overman MD   Signed by:   Everitt Amber LPN on 16/08/9603   Method used:   Samples Given   RxID:   [id number]

## 2010-12-25 ENCOUNTER — Other Ambulatory Visit (HOSPITAL_COMMUNITY): Payer: Self-pay

## 2010-12-26 ENCOUNTER — Ambulatory Visit (HOSPITAL_COMMUNITY)
Admission: RE | Admit: 2010-12-26 | Discharge: 2010-12-26 | Disposition: A | Discharge status: Home / Self Care | Payer: MEDICARE | Source: Ambulatory Visit | Attending: Ophthalmology | Admitting: Ophthalmology

## 2010-12-26 DIAGNOSIS — Z79899 Other long term (current) drug therapy: Secondary | ICD-10-CM | POA: Insufficient documentation

## 2010-12-26 DIAGNOSIS — I10 Essential (primary) hypertension: Secondary | ICD-10-CM | POA: Insufficient documentation

## 2010-12-26 DIAGNOSIS — H2510 Age-related nuclear cataract, unspecified eye: Secondary | ICD-10-CM | POA: Insufficient documentation

## 2011-01-02 ENCOUNTER — Telehealth: Payer: Self-pay | Admitting: Family Medicine

## 2011-01-08 NOTE — Progress Notes (Signed)
Summary: medicine  Phone Note Call from Patient   Summary of Call: needs her has no refills on it levothyroxine send to [facility] please Initial call taken by: Lind Guest,  January 02, 2011 10:15 AM    Prescriptions: LEVOXYL 50 MCG TABS (LEVOTHYROXINE SODIUM) Take 1 tablet by mouth once a day  #90 Tablet x 0   Entered by:   Adella Hare LPN   Authorized by:   Syliva Overman MD   Signed by:   Adella Hare LPN on 16/08/9603   Method used:   Electronically to        [facility] [address] [phone]* (retail)       [address] [place]  [address]       Ph: [phone]       Fax: [phone]   RxID:   [id number]

## 2011-01-28 LAB — BASIC METABOLIC PANEL
BUN: 24 mg/dL — ABNORMAL HIGH (ref 6–23)
CO2: 26 mEq/L (ref 19–32)
Chloride: 102 mEq/L (ref 96–112)
Creatinine, Ser: 1.05 mg/dL (ref 0.4–1.2)

## 2011-01-28 LAB — CBC
MCH: 32.9 pg (ref 26.0–34.0)
MCV: 97.4 fL (ref 78.0–100.0)
Platelets: 245 10*3/uL (ref 150–400)
RDW: 13.5 % (ref 11.5–15.5)
WBC: 3.9 10*3/uL — ABNORMAL LOW (ref 4.0–10.5)

## 2011-02-25 ENCOUNTER — Encounter: Payer: Self-pay | Admitting: Family Medicine

## 2011-02-26 ENCOUNTER — Encounter: Payer: Self-pay | Admitting: Family Medicine

## 2011-02-27 ENCOUNTER — Encounter: Payer: Self-pay | Admitting: Family Medicine

## 2011-02-28 ENCOUNTER — Ambulatory Visit (INDEPENDENT_AMBULATORY_CARE_PROVIDER_SITE_OTHER): Payer: MEDICARE | Admitting: Family Medicine

## 2011-02-28 ENCOUNTER — Encounter: Payer: Self-pay | Admitting: Family Medicine

## 2011-02-28 VITALS — BP 104/68 | HR 93 | Resp 16 | Ht 65.5 in | Wt 135.1 lb

## 2011-02-28 DIAGNOSIS — I10 Essential (primary) hypertension: Secondary | ICD-10-CM

## 2011-02-28 DIAGNOSIS — E785 Hyperlipidemia, unspecified: Secondary | ICD-10-CM

## 2011-02-28 DIAGNOSIS — E039 Hypothyroidism, unspecified: Secondary | ICD-10-CM

## 2011-02-28 LAB — TSH: TSH: 0.964 u[IU]/mL (ref 0.350–4.500)

## 2011-02-28 MED ORDER — OMEPRAZOLE 40 MG PO CPDR: 40.0000 mg | DELAYED_RELEASE_CAPSULE | Freq: Every day | ORAL | Status: DC

## 2011-02-28 MED ORDER — EZETIMIBE 10 MG PO TABS: 10.0000 mg | ORAL_TABLET | Freq: Every day | ORAL | Status: DC

## 2011-02-28 NOTE — Patient Instructions (Signed)
F/u in 4 months  TSH today.  Pls make sure you take the medication for your stomach daily.  I hope that your vision improves

## 2011-02-28 NOTE — Progress Notes (Signed)
  Subjective:    Patient ID: Tina Goodwin, female    DOB: [birth year], 77 y.o.   MRN: [id number]  HPI  Pt has had bilateral catarct  Extraction, the left in Feb of this year, states she cannot see from left eye since the surgery, which was fine before the operation reportedly. Otherwise she reports she has been doing well.  Review of Systems Denies recent fever or chills. Denies sinus pressure, nasal congestion, ear pain or sore throat. Denies chest congestion, productive cough or wheezing. Denies chest pains, palpitations, paroxysmal nocturnal dyspnea, orthopnea and leg swelling Denies abdominal pain, nausea, vomiting,diarrhea or constipation.  Denies rectal bleeding or change in bowel movement. Denies dysuria, frequency, hesitancy or incontinence. Denies joint pain, swelling and limitation and mobility. Denies headaches, seizure, numbness, or tingling. She does have some anxiety and mild depression over her reduced vision in the left eye,  But is hopeful that this will improve over time  Denies skin break down or rash.        Objective:   Physical Exam Patient alert and oriented and in no Cardiopulmonary distress.  HEENT: No facial asymmetry, EOMI, no sinus tenderness, TM's clear, Oropharynx pink and moist.  Neck supple no adenopathy.  Chest: Clear to auscultation bilaterally.  CVS: S1, S2 no murmurs, no S3.  ABD: Soft non tender. Bowel sounds normal.  Ext: No edema  MS: Adequate ROM spine, shoulders, hips and knees.  Skin: Intact, no ulcerations or rash noted.  Psych: Good eye contact, normal affect. Memory intact not anxious or depressed appearing.  CNS:  power, tone and sensation normal throughout.Reduced vision in left eye        Assessment & Plan:  1. Hypertension: controlled, no change in management. 2.Hyperlipidemia: zetia samples provided, fasting labs in 4 months before next visit. 3. Poor vision post operatively, pt will follow through with  opthalmology. 4. GERD: unchanged, pt to take prilosec

## 2011-03-03 ENCOUNTER — Other Ambulatory Visit: Payer: Self-pay

## 2011-03-03 MED ORDER — LEVOTHYROXINE SODIUM 50 MCG PO TABS: 50.0000 ug | ORAL_TABLET | Freq: Every day | ORAL | Status: DC

## 2011-03-05 ENCOUNTER — Telehealth: Payer: Self-pay | Admitting: Family Medicine

## 2011-03-10 NOTE — Telephone Encounter (Signed)
Attempted to return call twice, no answer

## 2011-04-03 ENCOUNTER — Other Ambulatory Visit: Payer: Self-pay | Admitting: Family Medicine

## 2011-04-03 ENCOUNTER — Telehealth: Payer: Self-pay | Admitting: Family Medicine

## 2011-04-03 NOTE — Telephone Encounter (Signed)
The pharmacy has already faxed and I filled it this am (for a 90 day supply)

## 2011-04-04 NOTE — Procedures (Signed)
NAMEMALI, EPPARD NO.:  [id number]   MEDICAL RECORD NO.:  [id number]                  PATIENT TYPE:  PREC   LOCATION:                                       FACILITY:   PHYSICIAN:  Vida Roller, M.D.                DATE OF BIRTH:   DATE OF PROCEDURE:  DATE OF DISCHARGE:                                    STRESS TEST   PROCEDURE:  Exercise Cardiolite.   INDICATION:  A 77 year old female with no known coronary artery disease with  progressive dyspnea on exertion.   CARDIAC RISK FACTORS:  Hyperlipidemia, hypertension and tobacco abuse.   BASELINE DATA:  Resting heart rate is 79 beats per minute, blood pressure  176/82.   DESCRIPTION OF PROCEDURE:  The patient exercised for a total of five  minutes, 14 seconds to Bruce protocol stage 2, and 7.0 mets.  Maximum heart  rate was 136 beats per minute which is 90% of predicted maximum.  Maximum  blood pressure is 220/92.  This resolved down to 168/82 in recovery.  Did  reveal some ST depression;  however, this is mostly upsloping in the inferior leads.  This resolved in  recovery.   The patient reported shortness of breath which resolved in recovery.  No  chest discomfort was noted.  The test was stopped secondary to fatigue.   FINAL IMAGES AND RESULTS:  Pending M.D. review.     ________________________________________  ___________________________________________  Jae Dire, P.A. [facility]                      Vida Roller, M.D.   AB/MEDQ  D:  02/29/2004  T:  02/29/2004  Job:  [id number]

## 2011-04-14 ENCOUNTER — Other Ambulatory Visit: Payer: Self-pay | Admitting: Family Medicine

## 2011-05-14 ENCOUNTER — Telehealth: Payer: Self-pay | Admitting: Family Medicine

## 2011-05-14 MED ORDER — NIFEDIPINE ER 30 MG PO TB24: 30.0000 mg | ORAL_TABLET | Freq: Every day | ORAL | Status: DC

## 2011-05-14 MED ORDER — HYDROCHLOROTHIAZIDE 25 MG PO TABS: 25.0000 mg | ORAL_TABLET | Freq: Every day | ORAL | Status: DC

## 2011-05-14 MED ORDER — ENALAPRIL MALEATE 20 MG PO TABS: 20.0000 mg | ORAL_TABLET | Freq: Every day | ORAL | Status: DC

## 2011-05-14 MED ORDER — LEVOTHYROXINE SODIUM 50 MCG PO TABS: 50.0000 ug | ORAL_TABLET | Freq: Every day | ORAL | Status: DC

## 2011-05-14 NOTE — Telephone Encounter (Signed)
meds sent as requested 

## 2011-05-15 ENCOUNTER — Other Ambulatory Visit: Payer: Self-pay

## 2011-05-15 MED ORDER — ENALAPRIL MALEATE 20 MG PO TABS: ORAL_TABLET | ORAL | Status: DC

## 2011-06-02 ENCOUNTER — Telehealth: Payer: Self-pay | Admitting: Family Medicine

## 2011-06-02 NOTE — Telephone Encounter (Signed)
Needs to have ov to evaluate the pain, unable to treat without this if tylenol is not working. Sorry to hear her vision is such a problem also.; Pls check if there is pus or draiange from the toe, iof there is needs appt this week

## 2011-06-02 NOTE — Telephone Encounter (Signed)
She is having terrible pain on the side of her left toe (where her bunion is) and swelling. It is hurting so bad it hurts to walk on it and she doesn't know if you want her to come in or if there is something she can take. She was very upset because she is gonna have to go to [facility] for an eye surgery and her vision has gotten bad also. [facility]

## 2011-06-03 NOTE — Telephone Encounter (Signed)
Patient aware. Tina Goodwin scheduled

## 2011-06-05 ENCOUNTER — Encounter: Payer: Self-pay | Admitting: Family Medicine

## 2011-06-05 ENCOUNTER — Ambulatory Visit: Payer: Medicare Other | Admitting: Family Medicine

## 2011-06-27 ENCOUNTER — Encounter: Payer: Self-pay | Admitting: Family Medicine

## 2011-06-30 ENCOUNTER — Encounter: Payer: Self-pay | Admitting: Family Medicine

## 2011-07-01 ENCOUNTER — Encounter: Payer: Self-pay | Admitting: Family Medicine

## 2011-07-01 ENCOUNTER — Other Ambulatory Visit: Payer: Self-pay | Admitting: *Deleted

## 2011-07-01 ENCOUNTER — Ambulatory Visit (INDEPENDENT_AMBULATORY_CARE_PROVIDER_SITE_OTHER): Payer: Medicare Other | Admitting: Family Medicine

## 2011-07-01 ENCOUNTER — Ambulatory Visit (HOSPITAL_COMMUNITY)
Admission: RE | Admit: 2011-07-01 | Discharge: 2011-07-01 | Disposition: A | Discharge status: Home / Self Care | Payer: Medicare Other | Source: Ambulatory Visit | Attending: Family Medicine | Admitting: Family Medicine

## 2011-07-01 VITALS — BP 130/70 | HR 105 | Ht 65.0 in | Wt 127.0 lb

## 2011-07-01 DIAGNOSIS — E785 Hyperlipidemia, unspecified: Secondary | ICD-10-CM

## 2011-07-01 DIAGNOSIS — R109 Unspecified abdominal pain: Secondary | ICD-10-CM | POA: Insufficient documentation

## 2011-07-01 DIAGNOSIS — R1900 Intra-abdominal and pelvic swelling, mass and lump, unspecified site: Secondary | ICD-10-CM

## 2011-07-01 DIAGNOSIS — R079 Chest pain, unspecified: Secondary | ICD-10-CM

## 2011-07-01 DIAGNOSIS — K869 Disease of pancreas, unspecified: Secondary | ICD-10-CM | POA: Insufficient documentation

## 2011-07-01 DIAGNOSIS — E039 Hypothyroidism, unspecified: Secondary | ICD-10-CM

## 2011-07-01 DIAGNOSIS — K8020 Calculus of gallbladder without cholecystitis without obstruction: Secondary | ICD-10-CM

## 2011-07-01 DIAGNOSIS — R5381 Other malaise: Secondary | ICD-10-CM

## 2011-07-01 DIAGNOSIS — Q619 Cystic kidney disease, unspecified: Secondary | ICD-10-CM | POA: Insufficient documentation

## 2011-07-01 DIAGNOSIS — I10 Essential (primary) hypertension: Secondary | ICD-10-CM

## 2011-07-01 DIAGNOSIS — R634 Abnormal weight loss: Secondary | ICD-10-CM | POA: Insufficient documentation

## 2011-07-01 DIAGNOSIS — R5383 Other fatigue: Secondary | ICD-10-CM

## 2011-07-01 LAB — LIPID PANEL
Cholesterol: 256 mg/dL — ABNORMAL HIGH (ref 0–200)
Triglycerides: 111 mg/dL (ref <150)

## 2011-07-01 LAB — HEPATIC FUNCTION PANEL
ALT: 25 U/L (ref 0–35)
ALT: 25 U/L (ref 0–35)
AST: 29 U/L (ref 0–37)
Bilirubin, Direct: 0.1 mg/dL (ref 0.0–0.3)
Indirect Bilirubin: 0.5 mg/dL (ref 0.0–0.9)
Total Protein: 8.4 g/dL — ABNORMAL HIGH (ref 6.0–8.3)

## 2011-07-01 LAB — BASIC METABOLIC PANEL
BUN: 19 mg/dL (ref 6–23)
CO2: 24 mEq/L (ref 19–32)
Chloride: 100 mEq/L (ref 96–112)
Creat: 1.2 mg/dL — ABNORMAL HIGH (ref 0.50–1.10)
Glucose, Bld: 101 mg/dL — ABNORMAL HIGH (ref 70–99)

## 2011-07-01 MED ORDER — ENALAPRIL MALEATE 20 MG PO TABS: ORAL_TABLET | ORAL | Status: DC

## 2011-07-01 MED ORDER — NIFEDIPINE ER 30 MG PO TB24: 30.0000 mg | ORAL_TABLET | Freq: Every day | ORAL | Status: DC

## 2011-07-01 MED ORDER — HYDROCHLOROTHIAZIDE 25 MG PO TABS: 25.0000 mg | ORAL_TABLET | Freq: Every day | ORAL | Status: DC

## 2011-07-01 MED ORDER — LEVOTHYROXINE SODIUM 50 MCG PO TABS: 50.0000 ug | ORAL_TABLET | Freq: Every day | ORAL | Status: DC

## 2011-07-01 MED ORDER — OMEPRAZOLE 40 MG PO CPDR: 40.0000 mg | DELAYED_RELEASE_CAPSULE | Freq: Every day | ORAL | Status: DC

## 2011-07-01 MED ORDER — CENTRUM PO LIQD: 10.0000 mL | Freq: Every day | ORAL | Status: AC

## 2011-07-01 MED ORDER — IOHEXOL 300 MG/ML  SOLN
100.0000 mL | Freq: Once | INTRAMUSCULAR | Status: AC | PRN
  Administered 2011-07-01: 100 mL via INTRAVENOUS

## 2011-07-01 NOTE — Progress Notes (Signed)
  Subjective:    Patient ID: Tina Goodwin, female    DOB: 11-15-1932, 77 y.o.   MRN: [id number]  HPI Pt reports that in the past several months she has gone downhill. She reports poor apetiie with weight loss, states she feels full easily, has generalized abdominal pain which goes to her back. She c/o increased exertional fatigue and chest discomfort with walking She reports pain in her left eye with loss of vision following surgery several months ago, and her decline in function has taken place since this   Review of Systems See HPI Denies recent fever or chills. Denies sinus pressure, nasal congestion, ear pain or sore throat. denies   chest congestion, productive cough or wheezing. denies  nausea, vomiting,diarrhea or constipation.   Intermittent  chest pains,denies  palpitations and leg swelling  Denies dysuria, frequency, hesitancy or incontinence. Denies joint pain, swelling and limitation in mobility. Denies headaches, seizures, numbness, or tingling. C/o mild depression and anxiety which has worsened with the recent problems with vision in the left eye Denies skin break down or rash.        Objective:   Physical Exam Patient alert and oriented and in no cardiopulmonary distress.Postive weight loss  HEENT: No facial asymmetry, EOMI, no sinus tenderness,  oropharynx pink and moist.  Neck supple no adenopathy.  Chest: Clear to auscultation bilaterally.  CVS: S1, S2 no murmurs, no S3.  ABD: Soft , epigastric and RUQ tenderness, with mass, no guarding or rebound Ext: No edema  MS: Adequate ROM spine, shoulders, hips and knees.  Skin: Intact, no ulcerations or rash noted.  Psych: Good eye contact, normal affect. Memory intact not anxious or depressed appearing.  CNS: CN 2-12 intact, power, tone and sensation normal throughout.        Assessment & Plan:

## 2011-07-01 NOTE — Patient Instructions (Addendum)
F/u in 6 weeks.  I am very concerned about your continued ability to safely live on your own, and you need to consider either a family home, or living with your daughter or near to her.  Pls also look into getting the [facility] Niece which helps with transport to the doctor to provide assistance for you.  You need blood test today, and allso a scan of your abdomen . You need colon cancer screening to be done, pls speak with your daughter about this,  All the best with your eye

## 2011-07-08 DIAGNOSIS — K8020 Calculus of gallbladder without cholecystitis without obstruction: Secondary | ICD-10-CM | POA: Insufficient documentation

## 2011-07-08 NOTE — Assessment & Plan Note (Signed)
Controlled, no change in medication  

## 2011-07-08 NOTE — Assessment & Plan Note (Signed)
Currently asymptomtic

## 2011-07-08 NOTE — Assessment & Plan Note (Signed)
History of increased fatigue including exertional fatigue, EKG negative for ischemia

## 2011-07-08 NOTE — Assessment & Plan Note (Signed)
Abdominal  and pelvic scan highly suspicious for pancreatic cancer. Pt refusing to let me discuss this with her daughter at this time, and unwilling to pursue diagnostic biopsy , wants to focus on eye at this time

## 2011-07-08 NOTE — Assessment & Plan Note (Signed)
Uncontrolled, sttaes she is taking zetia , though I doubt this. Has had NASH in the past and is statin intolerant

## 2011-08-12 ENCOUNTER — Other Ambulatory Visit: Payer: Self-pay | Admitting: Family Medicine

## 2011-08-13 ENCOUNTER — Other Ambulatory Visit: Payer: Self-pay | Admitting: Family Medicine

## 2011-08-18 ENCOUNTER — Telehealth: Payer: Self-pay | Admitting: Family Medicine

## 2011-08-18 MED ORDER — NIFEDIPINE ER 30 MG PO TB24: 30.0000 mg | ORAL_TABLET | Freq: Every day | ORAL | Status: DC

## 2011-08-18 MED ORDER — HYDROCHLOROTHIAZIDE 25 MG PO TABS: 25.0000 mg | ORAL_TABLET | Freq: Every day | ORAL | Status: DC

## 2011-08-18 MED ORDER — ENALAPRIL MALEATE 20 MG PO TABS: ORAL_TABLET | ORAL | Status: DC

## 2011-08-18 MED ORDER — LEVOTHYROXINE SODIUM 50 MCG PO TABS: 50.0000 ug | ORAL_TABLET | Freq: Every day | ORAL | Status: DC

## 2011-08-18 NOTE — Telephone Encounter (Signed)
meds sent as requested 

## 2011-09-19 ENCOUNTER — Encounter: Payer: Self-pay | Admitting: Family Medicine

## 2011-09-23 ENCOUNTER — Ambulatory Visit (INDEPENDENT_AMBULATORY_CARE_PROVIDER_SITE_OTHER): Payer: Medicare Other | Admitting: Family Medicine

## 2011-09-23 ENCOUNTER — Encounter: Payer: Self-pay | Admitting: Family Medicine

## 2011-09-23 VITALS — BP 130/70 | HR 97 | Resp 16 | Ht 65.0 in | Wt 131.0 lb

## 2011-09-23 DIAGNOSIS — R5381 Other malaise: Secondary | ICD-10-CM

## 2011-09-23 DIAGNOSIS — R5383 Other fatigue: Secondary | ICD-10-CM

## 2011-09-23 DIAGNOSIS — K8689 Other specified diseases of pancreas: Secondary | ICD-10-CM

## 2011-09-23 DIAGNOSIS — E785 Hyperlipidemia, unspecified: Secondary | ICD-10-CM

## 2011-09-23 DIAGNOSIS — I10 Essential (primary) hypertension: Secondary | ICD-10-CM

## 2011-09-23 DIAGNOSIS — K869 Disease of pancreas, unspecified: Secondary | ICD-10-CM

## 2011-09-23 DIAGNOSIS — E039 Hypothyroidism, unspecified: Secondary | ICD-10-CM

## 2011-09-23 NOTE — Progress Notes (Signed)
  Subjective:    Patient ID: Tina Goodwin, female    DOB: 1932/04/16, 77 y.o.   MRN: [id number]  HPI The PT is here for follow up and re-evaluation of chronic medical conditions, medication management and review of any available recent lab and radiology data.  Preventive health is updated, specifically  Cancer screening and Immunization.  Refuses all cancer screening Questions or concerns regarding consultations or procedures which the PT has had in the interim are  addressed. The PT denies any adverse reactions to current medications since the last visit.  She had repeat surgery on her left eye at [facility] in September and reports improved vision, she is optimistic about this. She personally pulled out 2 or 3 teeth in her lower jaw which were painful, states she plas to do the same with some others since they hurt. Unwilling to further evaluate possible pancreatic mass at this time. She still has not told her daughter and intends to hold off until eye and teeth are taken care of. Reports good apetite and ha gained weight, denies any pain    Review of Systems See HPI Denies recent fever or chills. Denies sinus pressure, nasal congestion, ear pain or sore throat. Denies chest congestion, productive cough or wheezing. Denies chest pains, palpitations and leg swelling Denies abdominal pain, nausea, vomiting,diarrhea or constipation.   Denies dysuria, frequency, hesitancy or incontinence. Denies joint pain, swelling and limitation in mobility. Denies headaches, seizures, numbness, or tingling. Denies depression, anxiety or insomnia. Denies skin break down or rash.        Objective:   Physical Exam Patient alert and oriented and in no cardiopulmonary distress.  HEENT: No facial asymmetry, EOMI, no sinus tenderness,  oropharynx pink and moist.  Neck supple no adenopathy.  Chest: Clear to auscultation bilaterally.  CVS: S1, S2 no murmurs, no S3.  ABD: Soft non tender. Bowel  sounds normal.  Ext: No edema  MS: Adequate ROM spine, shoulders, hips and knees.  Skin: Intact, no ulcerations or rash noted.  Psych: Good eye contact, normal affect. Memory intact not anxious or depressed appearing.  CNS: CN 2-12 intact, power, tone and sensation normal throughout.        Assessment & Plan:

## 2011-09-23 NOTE — Patient Instructions (Signed)
F/u in march.  Fasting lipid, hepatic, chem 7, TSH in march prior to next oV   No med changes at this time.  I am thankful that  Your vision is improved.  We will discuss the abnormal cat scan next year per your request.  Continue to use advil or ibuprofen for you left wrist pain as needed, call for medication if you need it

## 2011-09-24 DIAGNOSIS — K8689 Other specified diseases of pancreas: Secondary | ICD-10-CM | POA: Insufficient documentation

## 2011-09-24 NOTE — Assessment & Plan Note (Signed)
Radiologic diagnosis only which needs surgical evaluation pt still refusing to address this at this time, and still has not discussed this with her daughter or son, despite my recommendation at the time the study was done approx 3 months ago

## 2011-09-24 NOTE — Assessment & Plan Note (Signed)
Controlled, no change in medication. rept lab in 5 months before next visit

## 2011-09-24 NOTE — Assessment & Plan Note (Signed)
Controlled, no change in medication  

## 2011-09-24 NOTE — Assessment & Plan Note (Signed)
Uncontrolled and intolerant of statins, currently reports being on zetia. Rept lab prior to next visit. Hyperlipidemia:Low fat diet discussed and encouraged.

## 2011-10-07 ENCOUNTER — Telehealth: Payer: Self-pay | Admitting: Family Medicine

## 2011-10-07 MED ORDER — LEVOTHYROXINE SODIUM 50 MCG PO TABS: 50.0000 ug | ORAL_TABLET | Freq: Every day | ORAL | Status: DC

## 2011-10-07 MED ORDER — OMEPRAZOLE 40 MG PO CPDR: 40.0000 mg | DELAYED_RELEASE_CAPSULE | Freq: Every day | ORAL | Status: DC

## 2011-10-07 NOTE — Telephone Encounter (Signed)
Sent in

## 2011-11-13 ENCOUNTER — Other Ambulatory Visit: Payer: Self-pay | Admitting: Family Medicine

## 2011-11-19 ENCOUNTER — Telehealth: Payer: Self-pay | Admitting: Family Medicine

## 2011-11-19 NOTE — Telephone Encounter (Signed)
Patient spoke with Toni Amend

## 2012-01-07 ENCOUNTER — Telehealth: Payer: Self-pay | Admitting: Family Medicine

## 2012-01-07 MED ORDER — OMEPRAZOLE 40 MG PO CPDR: 40.0000 mg | DELAYED_RELEASE_CAPSULE | Freq: Every day | ORAL | Status: DC

## 2012-01-07 MED ORDER — LEVOTHYROXINE SODIUM 50 MCG PO TABS: 50.0000 ug | ORAL_TABLET | Freq: Every day | ORAL | Status: DC

## 2012-01-07 NOTE — Telephone Encounter (Signed)
Sent in per request 

## 2012-01-21 ENCOUNTER — Other Ambulatory Visit: Payer: Self-pay | Admitting: Family Medicine

## 2012-01-21 ENCOUNTER — Encounter: Payer: Self-pay | Admitting: Family Medicine

## 2012-01-21 ENCOUNTER — Ambulatory Visit (INDEPENDENT_AMBULATORY_CARE_PROVIDER_SITE_OTHER): Payer: Medicare Other | Admitting: Family Medicine

## 2012-01-21 VITALS — BP 136/72 | HR 91 | Resp 18 | Ht 65.0 in | Wt 136.0 lb

## 2012-01-21 DIAGNOSIS — E785 Hyperlipidemia, unspecified: Secondary | ICD-10-CM

## 2012-01-21 DIAGNOSIS — E039 Hypothyroidism, unspecified: Secondary | ICD-10-CM

## 2012-01-21 DIAGNOSIS — I10 Essential (primary) hypertension: Secondary | ICD-10-CM

## 2012-01-21 DIAGNOSIS — K8689 Other specified diseases of pancreas: Secondary | ICD-10-CM

## 2012-01-21 DIAGNOSIS — K869 Disease of pancreas, unspecified: Secondary | ICD-10-CM

## 2012-01-21 LAB — COMPREHENSIVE METABOLIC PANEL
AST: 36 U/L (ref 0–37)
Albumin: 4.3 g/dL (ref 3.5–5.2)
BUN: 18 mg/dL (ref 6–23)
Calcium: 10.2 mg/dL (ref 8.4–10.5)
Chloride: 104 mEq/L (ref 96–112)
Creat: 0.95 mg/dL (ref 0.50–1.10)
Glucose, Bld: 99 mg/dL (ref 70–99)
Potassium: 4.5 mEq/L (ref 3.5–5.3)

## 2012-01-21 LAB — LIPID PANEL
HDL: 60 mg/dL (ref >39)
LDL Cholesterol: 143 mg/dL — ABNORMAL HIGH (ref 0–99)
Triglycerides: 149 mg/dL (ref <150)

## 2012-01-21 NOTE — Assessment & Plan Note (Signed)
Updated labs needed.

## 2012-01-21 NOTE — Patient Instructions (Signed)
F/U in early September.   Lipid, cmp and CBC today, fasting   You absolutely need  To have follow up of the abnormal scan of your abdomen as I have discussed before.  You need to see a stomach specialist., please decide to take care of this and you need to involve your children in your medical issues

## 2012-01-21 NOTE — Progress Notes (Signed)
  Subjective:    Patient ID: Tina Goodwin, female    DOB: 11-05-1932, 78 y.o.   MRN: [id number]  HPI The PT is here for follow up and re-evaluation of chronic medical conditions, medication management and review of any available recent lab and radiology data.  Preventive health is updated, specifically  Cancer screening and Immunization.   Questions or concerns regarding consultations or procedures which the PT has had in the interim are  Addressed.Sees opthalmology at [facility] every 3 months, gradual improvement in pain and vision in left eye The PT denies any adverse reactions to current medications since the last visit.  There are no new concerns.  There are no specific complaints . Reports improved appetite and has gained weight.Still has not discussed the concern of intestinal cancer with her children and the need for further testing, states she has to get her eye and teeth taken care of first. Denies abdominal pain, weight loss or change in bowel movements      Review of Systems See HPI Denies recent fever or chills. Denies sinus pressure, nasal congestion, ear pain or sore throat. Denies chest congestion, productive cough or wheezing. Denies chest pains, palpitations and leg swelling Denies  nausea, vomiting,diarrhea or constipation.   Denies dysuria, frequency, hesitancy or incontinence. Denies joint pain, swelling and limitation in mobility. Denies headaches, seizures, numbness, or tingling. Denies depression, anxiety or insomnia. Denies skin break down or rash.        Objective:   Physical Exam  Patient alert and oriented and in no cardiopulmonary distress.  HEENT: No facial asymmetry, EOMI, no sinus tenderness,  oropharynx pink and moist.  Neck supple no adenopathy.  Chest: Clear to auscultation bilaterally.  CVS: S1, S2 no murmurs, no S3.  ABD: Soft non tender. Bowel sounds normal.Possible LUQ mass  Ext: No edema  MS: Adequate though reduced  ROM spine,  shoulders, hips and knees.  Skin: Intact, no ulcerations or rash noted.  Psych: Good eye contact, normal affect. Memory intact not anxious or depressed appearing.  CNS: CN 2-12 intact, power, tone and sensation normal throughout.       Assessment & Plan:

## 2012-01-21 NOTE — Assessment & Plan Note (Signed)
Controlled, no change in medication  

## 2012-01-21 NOTE — Assessment & Plan Note (Signed)
Hyperlipidemia:Low fat diet discussed and encouraged.  Updated lab today 

## 2012-01-21 NOTE — Assessment & Plan Note (Signed)
Unwilling still to further investigate the complaint, and not discussing with children at this time

## 2012-01-22 ENCOUNTER — Other Ambulatory Visit: Payer: Self-pay | Admitting: Family Medicine

## 2012-01-22 DIAGNOSIS — D490 Neoplasm of unspecified behavior of digestive system: Secondary | ICD-10-CM

## 2012-01-22 DIAGNOSIS — D649 Anemia, unspecified: Secondary | ICD-10-CM

## 2012-01-22 LAB — CBC
HCT: 30.8 % — ABNORMAL LOW (ref 36.0–46.0)
Hemoglobin: 8.9 g/dL — ABNORMAL LOW (ref 12.0–15.0)
MCHC: 28.9 g/dL — ABNORMAL LOW (ref 30.0–36.0)
MCV: 80.6 fL (ref 78.0–100.0)
RDW: 15.8 % — ABNORMAL HIGH (ref 11.5–15.5)
WBC: 5.6 10*3/uL (ref 4.0–10.5)

## 2012-01-22 LAB — ANEMIA PANEL
ABS Retic: 81.5 10*3/uL (ref 19.0–186.0)
Ferritin: 10 ng/mL (ref 10–291)
Iron: 17 ug/dL — ABNORMAL LOW (ref 42–145)
RBC.: 3.88 MIL/uL (ref 3.87–5.11)

## 2012-01-22 LAB — TSH: TSH: 0.351 u[IU]/mL (ref 0.350–4.500)

## 2012-01-22 NOTE — Progress Notes (Signed)
Addended by: Kandis Fantasia B on: 01/22/2012 04:57 PM   Modules accepted: Orders

## 2012-01-23 ENCOUNTER — Other Ambulatory Visit: Payer: Self-pay | Admitting: Family Medicine

## 2012-01-23 MED ORDER — FERROUS FUMARATE 324 MG PO TABS: 1.0000 | ORAL_TABLET | Freq: Two times a day (BID) | ORAL | Status: DC

## 2012-02-17 ENCOUNTER — Telehealth: Payer: Self-pay | Admitting: Family Medicine

## 2012-02-17 NOTE — Telephone Encounter (Signed)
We do not keep samples in the office. If she calls back, we cannot get those

## 2012-04-06 ENCOUNTER — Telehealth: Payer: Self-pay | Admitting: Family Medicine

## 2012-04-06 MED ORDER — LEVOTHYROXINE SODIUM 50 MCG PO TABS: 50.0000 ug | ORAL_TABLET | Freq: Every day | ORAL | Status: DC

## 2012-04-06 NOTE — Telephone Encounter (Signed)
Sent in

## 2012-04-08 ENCOUNTER — Other Ambulatory Visit: Payer: Self-pay

## 2012-04-08 MED ORDER — OMEPRAZOLE 40 MG PO CPDR: 40.0000 mg | DELAYED_RELEASE_CAPSULE | Freq: Every day | ORAL | Status: DC

## 2012-04-28 ENCOUNTER — Ambulatory Visit: Payer: Medicare Other | Admitting: Family Medicine

## 2012-05-16 ENCOUNTER — Other Ambulatory Visit: Payer: Self-pay | Admitting: Family Medicine

## 2012-05-17 ENCOUNTER — Other Ambulatory Visit: Payer: Self-pay | Admitting: Family Medicine

## 2012-05-24 ENCOUNTER — Telehealth: Payer: Self-pay | Admitting: Family Medicine

## 2012-05-24 MED ORDER — HYDROCHLOROTHIAZIDE 25 MG PO TABS: ORAL_TABLET | ORAL | Status: DC

## 2012-05-24 MED ORDER — ENALAPRIL MALEATE 20 MG PO TABS: ORAL_TABLET | ORAL | Status: DC

## 2012-05-24 NOTE — Telephone Encounter (Signed)
Sent in

## 2012-05-24 NOTE — Telephone Encounter (Signed)
[facility] not [facility]

## 2012-06-29 ENCOUNTER — Other Ambulatory Visit: Payer: Self-pay | Admitting: Family Medicine

## 2012-07-05 ENCOUNTER — Telehealth: Payer: Self-pay | Admitting: Family Medicine

## 2012-07-05 ENCOUNTER — Other Ambulatory Visit: Payer: Self-pay

## 2012-07-05 MED ORDER — LEVOTHYROXINE SODIUM 50 MCG PO TABS: 50.0000 ug | ORAL_TABLET | Freq: Every day | ORAL | Status: DC

## 2012-07-05 NOTE — Telephone Encounter (Signed)
Med refilled.

## 2012-07-23 ENCOUNTER — Telehealth: Payer: Self-pay | Admitting: Family Medicine

## 2012-07-23 MED ORDER — OMEPRAZOLE 40 MG PO CPDR: 40.0000 mg | DELAYED_RELEASE_CAPSULE | Freq: Every day | ORAL | Status: DC

## 2012-07-23 NOTE — Telephone Encounter (Signed)
Refilled

## 2012-07-28 ENCOUNTER — Ambulatory Visit (INDEPENDENT_AMBULATORY_CARE_PROVIDER_SITE_OTHER): Payer: Medicare Other | Admitting: Family Medicine

## 2012-07-28 ENCOUNTER — Encounter: Payer: Self-pay | Admitting: Family Medicine

## 2012-07-28 VITALS — BP 130/70 | HR 71 | Resp 15 | Ht 65.0 in | Wt 125.0 lb

## 2012-07-28 DIAGNOSIS — K869 Disease of pancreas, unspecified: Secondary | ICD-10-CM

## 2012-07-28 DIAGNOSIS — E785 Hyperlipidemia, unspecified: Secondary | ICD-10-CM

## 2012-07-28 DIAGNOSIS — Z23 Encounter for immunization: Secondary | ICD-10-CM

## 2012-07-28 DIAGNOSIS — D509 Iron deficiency anemia, unspecified: Secondary | ICD-10-CM

## 2012-07-28 DIAGNOSIS — E039 Hypothyroidism, unspecified: Secondary | ICD-10-CM

## 2012-07-28 DIAGNOSIS — K8689 Other specified diseases of pancreas: Secondary | ICD-10-CM

## 2012-07-28 DIAGNOSIS — I10 Essential (primary) hypertension: Secondary | ICD-10-CM

## 2012-07-28 NOTE — Assessment & Plan Note (Signed)
Updated lab needed to ascertain whether pt is on correct dose espescially as she has lost weight

## 2012-07-28 NOTE — Assessment & Plan Note (Signed)
Hyperlipidemia:Low fat diet discussed and encouraged.  Statin intolerant, updated lab next week

## 2012-07-28 NOTE — Progress Notes (Signed)
  Subjective:    Patient ID: Tina Goodwin, female    DOB: 11-25-1931, 78 y.o.   MRN: [id number]  HPI The PT is here for follow up and re-evaluation of chronic medical conditions, medication management and review of any available recent lab and radiology data.  Preventive health is updated, specifically  Cancer screening and Immunization.   Questions or concerns regarding consultations or procedures which the PT has had in the interim are  Addressed.Still has pain and reduced vision in left eye, has appt at [facility] in October, wants no GI evaluation for possible pancreatic cancer and refuses colonoscopy. Still refuses to have her children involved, has lost 10 pounds since last visit The PT denies any adverse reactions to current medications since the last visit.  There are no new concerns.  There are no specific complaints       Review of Systems See HPI Denies recent fever or chills. Denies sinus pressure, nasal congestion, ear pain or sore throat. Denies chest congestion, productive cough or wheezing. Denies chest pains, palpitations and leg swelling Denies abdominal pain, nausea, vomiting,diarrhea or constipation.   Denies dysuria, frequency, hesitancy or incontinence. Occasional joint pain,denies  swelling and limitation in mobility. Denies headaches, seizures, numbness, or tingling. Denies depression, anxiety or insomnia. Denies skin break down or rash.        Objective:   Physical Exam  Patient alert and oriented and in no cardiopulmonary distress.  HEENT: No facial asymmetry, EOMI, no sinus tenderness,  oropharynx pink and moist.  Neck supple no adenopathy.  Chest: Clear to auscultation bilaterally.  CVS: S1, S2 no murmurs, no S3.  ABD: Soft possible hepatomegaly  Ext: No edema  MS: Adequate ROM spine, shoulders, hips and knees.  Skin: Intact, no ulcerations or rash noted.  Psych: Good eye contact, normal affect. Memory intact not anxious or depressed  appearing.  CNS: CN 2-12 intact, power, tone and sensation normal throughout.       Assessment & Plan:

## 2012-07-28 NOTE — Patient Instructions (Addendum)
F/u in march 2014  Fasting lipid, cmp and fasting in March   TSH and cbc, and iron as soon as possible Please reconsider your decision about the stomach specialist, you need to see one  I need to speak with our daughter/son about your health, I am concerned about weight loss

## 2012-07-28 NOTE — Assessment & Plan Note (Signed)
Real concern for malignancy, in GI tract, pt is anemic and has lost weight , exam reveals possible RUQ mass also. Needs rept hb and iron level, needs GI eval but refuses, and refuses to have me contact her children

## 2012-07-28 NOTE — Assessment & Plan Note (Signed)
Controlled, no change in medication  

## 2012-08-20 ENCOUNTER — Other Ambulatory Visit: Payer: Self-pay

## 2012-08-20 MED ORDER — NIFEDIPINE ER 30 MG PO TB24: ORAL_TABLET | ORAL | Status: DC

## 2012-08-20 MED ORDER — ENALAPRIL MALEATE 20 MG PO TABS: ORAL_TABLET | ORAL | Status: DC

## 2012-08-20 MED ORDER — HYDROCHLOROTHIAZIDE 25 MG PO TABS: ORAL_TABLET | ORAL | Status: DC

## 2012-10-06 ENCOUNTER — Telehealth: Payer: Self-pay | Admitting: Family Medicine

## 2012-10-06 ENCOUNTER — Other Ambulatory Visit: Payer: Self-pay

## 2012-10-06 MED ORDER — LEVOTHYROXINE SODIUM 50 MCG PO TABS: 50.0000 ug | ORAL_TABLET | Freq: Every day | ORAL | Status: DC

## 2012-10-06 NOTE — Telephone Encounter (Signed)
Med refilled.

## 2012-10-26 ENCOUNTER — Telehealth: Payer: Self-pay | Admitting: Family Medicine

## 2012-10-26 MED ORDER — OMEPRAZOLE 40 MG PO CPDR: 40.0000 mg | DELAYED_RELEASE_CAPSULE | Freq: Every day | ORAL | Status: DC

## 2012-10-26 MED ORDER — ENALAPRIL MALEATE 20 MG PO TABS: ORAL_TABLET | ORAL | Status: DC

## 2012-10-26 NOTE — Telephone Encounter (Signed)
Sent in

## 2012-11-23 ENCOUNTER — Telehealth: Payer: Self-pay | Admitting: Family Medicine

## 2012-11-23 MED ORDER — ENALAPRIL MALEATE 20 MG PO TABS: ORAL_TABLET | ORAL | Status: DC

## 2012-11-23 MED ORDER — HYDROCHLOROTHIAZIDE 25 MG PO TABS: ORAL_TABLET | ORAL | Status: DC

## 2012-11-23 NOTE — Telephone Encounter (Signed)
Patient aware.

## 2012-12-01 ENCOUNTER — Other Ambulatory Visit: Payer: Self-pay

## 2012-12-01 MED ORDER — NIFEDIPINE ER 30 MG PO TB24: ORAL_TABLET | ORAL | Status: DC

## 2012-12-27 ENCOUNTER — Telehealth: Payer: Self-pay | Admitting: Family Medicine

## 2012-12-27 MED ORDER — EZETIMIBE 10 MG PO TABS: 10.0000 mg | ORAL_TABLET | Freq: Every day | ORAL | Status: DC

## 2012-12-27 NOTE — Telephone Encounter (Signed)
Med refilled.

## 2013-01-06 ENCOUNTER — Other Ambulatory Visit: Payer: Self-pay | Admitting: Family Medicine

## 2013-01-10 ENCOUNTER — Telehealth: Payer: Self-pay | Admitting: Family Medicine

## 2013-01-10 NOTE — Telephone Encounter (Signed)
Med refilled.

## 2013-01-25 ENCOUNTER — Ambulatory Visit: Payer: Medicare Other | Admitting: Family Medicine

## 2013-02-02 ENCOUNTER — Ambulatory Visit: Payer: Medicare Other | Admitting: Family Medicine

## 2013-02-17 ENCOUNTER — Telehealth: Payer: Self-pay | Admitting: Family Medicine

## 2013-02-17 MED ORDER — OMEPRAZOLE 40 MG PO CPDR: 40.0000 mg | DELAYED_RELEASE_CAPSULE | Freq: Every day | ORAL | Status: DC

## 2013-02-17 NOTE — Telephone Encounter (Signed)
Refill sent.

## 2013-02-28 ENCOUNTER — Telehealth: Payer: Self-pay | Admitting: Family Medicine

## 2013-02-28 MED ORDER — ENALAPRIL MALEATE 20 MG PO TABS: ORAL_TABLET | ORAL | Status: DC

## 2013-02-28 MED ORDER — HYDROCHLOROTHIAZIDE 25 MG PO TABS: ORAL_TABLET | ORAL | Status: DC

## 2013-02-28 MED ORDER — NIFEDIPINE ER 30 MG PO TB24: ORAL_TABLET | ORAL | Status: DC

## 2013-02-28 NOTE — Telephone Encounter (Signed)
meds sent

## 2013-04-18 ENCOUNTER — Telehealth: Payer: Self-pay | Admitting: Family Medicine

## 2013-04-18 MED ORDER — LEVOTHYROXINE SODIUM 50 MCG PO TABS: ORAL_TABLET | ORAL | Status: DC

## 2013-04-18 NOTE — Telephone Encounter (Signed)
Med refilled.

## 2013-05-23 ENCOUNTER — Telehealth: Payer: Self-pay | Admitting: Family Medicine

## 2013-05-23 ENCOUNTER — Other Ambulatory Visit: Payer: Self-pay

## 2013-05-23 MED ORDER — NIFEDIPINE ER 30 MG PO TB24: ORAL_TABLET | ORAL | Status: DC

## 2013-05-23 MED ORDER — OMEPRAZOLE 40 MG PO CPDR: 40.0000 mg | DELAYED_RELEASE_CAPSULE | Freq: Every day | ORAL | Status: DC

## 2013-05-23 MED ORDER — HYDROCHLOROTHIAZIDE 25 MG PO TABS: ORAL_TABLET | ORAL | Status: DC

## 2013-05-23 NOTE — Telephone Encounter (Signed)
meds refilled with 90 day supply to [facility]

## 2013-06-09 ENCOUNTER — Other Ambulatory Visit: Payer: Self-pay

## 2013-06-09 ENCOUNTER — Telehealth: Payer: Self-pay | Admitting: Family Medicine

## 2013-06-09 MED ORDER — ENALAPRIL MALEATE 20 MG PO TABS: ORAL_TABLET | ORAL | Status: DC

## 2013-06-09 NOTE — Telephone Encounter (Signed)
Med refilled as requested

## 2013-07-12 ENCOUNTER — Telehealth: Payer: Self-pay | Admitting: Family Medicine

## 2013-07-12 DIAGNOSIS — E039 Hypothyroidism, unspecified: Secondary | ICD-10-CM

## 2013-07-12 DIAGNOSIS — E785 Hyperlipidemia, unspecified: Secondary | ICD-10-CM

## 2013-07-12 DIAGNOSIS — D509 Iron deficiency anemia, unspecified: Secondary | ICD-10-CM

## 2013-07-12 DIAGNOSIS — I10 Essential (primary) hypertension: Secondary | ICD-10-CM

## 2013-07-12 DIAGNOSIS — R5381 Other malaise: Secondary | ICD-10-CM

## 2013-07-12 MED ORDER — LEVOTHYROXINE SODIUM 50 MCG PO TABS: ORAL_TABLET | ORAL | Status: DC

## 2013-07-12 MED ORDER — EZETIMIBE 10 MG PO TABS: 10.0000 mg | ORAL_TABLET | Freq: Every day | ORAL | Status: DC

## 2013-07-12 NOTE — Telephone Encounter (Signed)
Aware to have labs done and refilled her meds for a 30 day supply until we get her labs done

## 2013-07-12 NOTE — Telephone Encounter (Signed)
Cbc, fasting lipid, cmp and TSH. Explain she takes med for all of these and has ahd no blood work since 01/2012, this is dangerous , I need to see the labs. Needs them by TOMORROW pls explain

## 2013-07-12 NOTE — Telephone Encounter (Signed)
Wants a 90 day supply sent in. What labs are needed?

## 2013-07-14 LAB — CBC WITH DIFFERENTIAL/PLATELET
Basophils Absolute: 0.1 10*3/uL (ref 0.0–0.1)
HCT: 36.7 % (ref 36.0–46.0)
Lymphocytes Relative: 33 % (ref 12–46)
Neutro Abs: 1.7 10*3/uL (ref 1.7–7.7)
Platelets: 257 10*3/uL (ref 150–400)
RBC: 3.8 MIL/uL — ABNORMAL LOW (ref 3.87–5.11)
RDW: 15.4 % (ref 11.5–15.5)
WBC: 3.5 10*3/uL — ABNORMAL LOW (ref 4.0–10.5)

## 2013-07-14 LAB — COMPREHENSIVE METABOLIC PANEL
ALT: 15 U/L (ref 0–35)
AST: 22 U/L (ref 0–37)
Albumin: 4.5 g/dL (ref 3.5–5.2)
Alkaline Phosphatase: 125 U/L — ABNORMAL HIGH (ref 39–117)
BUN: 21 mg/dL (ref 6–23)
Potassium: 4.4 mEq/L (ref 3.5–5.3)
Sodium: 140 mEq/L (ref 135–145)

## 2013-07-14 LAB — LIPID PANEL
HDL: 73 mg/dL (ref >39)
LDL Cholesterol: 136 mg/dL — ABNORMAL HIGH (ref 0–99)

## 2013-07-14 LAB — TSH: TSH: 0.298 u[IU]/mL — ABNORMAL LOW (ref 0.350–4.500)

## 2013-08-04 ENCOUNTER — Telehealth: Payer: Self-pay | Admitting: Family Medicine

## 2013-08-04 ENCOUNTER — Telehealth: Payer: Self-pay

## 2013-08-04 ENCOUNTER — Ambulatory Visit (INDEPENDENT_AMBULATORY_CARE_PROVIDER_SITE_OTHER): Payer: Medicare Other | Admitting: Family Medicine

## 2013-08-04 ENCOUNTER — Encounter: Payer: Self-pay | Admitting: Family Medicine

## 2013-08-04 VITALS — BP 128/80 | HR 73 | Resp 16 | Ht 65.0 in | Wt 128.4 lb

## 2013-08-04 DIAGNOSIS — Z0000 Encounter for general adult medical examination without abnormal findings: Secondary | ICD-10-CM

## 2013-08-04 DIAGNOSIS — E785 Hyperlipidemia, unspecified: Secondary | ICD-10-CM

## 2013-08-04 DIAGNOSIS — I10 Essential (primary) hypertension: Secondary | ICD-10-CM

## 2013-08-04 DIAGNOSIS — E039 Hypothyroidism, unspecified: Secondary | ICD-10-CM

## 2013-08-04 DIAGNOSIS — Z1211 Encounter for screening for malignant neoplasm of colon: Secondary | ICD-10-CM

## 2013-08-04 DIAGNOSIS — R5381 Other malaise: Secondary | ICD-10-CM

## 2013-08-04 DIAGNOSIS — R1900 Intra-abdominal and pelvic swelling, mass and lump, unspecified site: Secondary | ICD-10-CM

## 2013-08-04 DIAGNOSIS — M549 Dorsalgia, unspecified: Secondary | ICD-10-CM

## 2013-08-04 DIAGNOSIS — D509 Iron deficiency anemia, unspecified: Secondary | ICD-10-CM

## 2013-08-04 MED ORDER — NIFEDIPINE ER 30 MG PO TB24: ORAL_TABLET | ORAL | Status: DC

## 2013-08-04 MED ORDER — HYDROCHLOROTHIAZIDE 25 MG PO TABS: ORAL_TABLET | ORAL | Status: DC

## 2013-08-04 MED ORDER — EZETIMIBE 10 MG PO TABS: 10.0000 mg | ORAL_TABLET | Freq: Every day | ORAL | Status: DC

## 2013-08-04 MED ORDER — OMEPRAZOLE 40 MG PO CPDR: 40.0000 mg | DELAYED_RELEASE_CAPSULE | Freq: Every day | ORAL | Status: DC

## 2013-08-04 MED ORDER — ACETAMINOPHEN 325 MG PO TABS: ORAL_TABLET | ORAL | Status: DC

## 2013-08-04 MED ORDER — LEVOTHYROXINE SODIUM 50 MCG PO TABS: 50.0000 ug | ORAL_TABLET | Freq: Every day | ORAL | Status: DC

## 2013-08-04 MED ORDER — ENALAPRIL MALEATE 20 MG PO TABS: ORAL_TABLET | ORAL | Status: DC

## 2013-08-04 NOTE — Telephone Encounter (Signed)
Blood pressure today was good so if she has been opff of it for 2 month, no need to resume, I will remove from med list let her know

## 2013-08-04 NOTE — Telephone Encounter (Signed)
Dr. Lodema Hong is aware of this

## 2013-08-04 NOTE — Progress Notes (Signed)
Subjective:    Patient ID: Tina Goodwin, female    DOB: 1932/04/29, 79 y.o.   MRN: [id number]  HPI  Preventive Screening-Counseling & Management   Patient present here today for a Medicare annual wellness visit. Chronic disease reviewed as well as general exam done   Current Problems (verified)   Medications Prior to Visit Allergies (verified)   PAST HISTORY  Family History:Mom i and Dad deceased in their 82's, both had cancer unknown primary  7 siblings alive . 4 deceased ,. 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 No , remains somewhat socially isolated , encouraged to join senior citizens grp , she will think about this Over the past two weeks, have you felt little interest or pleasure in doing things? No  Have you lost interest or pleasure in daily life? No  Do you often feel hopeless? No  Do you cry easily over simple problems? No   Activities of Daily Living  In your present state of health, do you have any difficulty performing the following activities?  Driving?: No, though I am unaware of her driving for at least 3 years, walks  Managing money?: No Feeding yourself?:No Getting from bed to chair?:No Climbing a flight of stairs?:No Preparing food and eating?:No Bathing or showering?:No Getting dressed?:No Getting to the toilet?:No Using the toilet?:No Moving around from place to place?: No  Fall Risk Assessment In the past year have you fallen or had a near  fall?:No Are you currently taking any medications that make you dizziy?:No   Hearing Difficulties: No Do you often ask people to speak up or repeat themselves?:No Do you experience ringing or noises in your ears?:No Do you have difficulty understanding soft or whispered voices?:No  Cognitive Testing  Alert? Yes Normal Appearance?Yes  Oriented to person? Yes Place? Yes  Time? Yes  Displays appropriate judgment?Yes  Can read the correct time from a watch face? yes Are you having problems remembering things?No  Advanced Directives have been discussed with the patient?Yes , full code   List the Names of Other Physician/Practitioners you currently use: none   Indicate any recent Medical Services you may have received from other than [facility] providers in the past year (date may be approximate).   Assessment:    Annual Wellness Exam   Plan:    During the course of the visit the patient was educated and counseled about appropriate screening and preventive services including:  A healthy diet is rich in fruit, vegetables and whole grains. Poultry fish, nuts and beans are a healthy choice for protein rather then red meat. A low sodium diet and drinking 64 ounces of water daily is generally recommended. Oils and sweet should be limited. Carbohydrates especially for those who are diabetic or overweight, should be limited to 30-45 gram per meal. It is important to eat on a regular schedule, at least 3 times daily. Snacks should be primarily fruits, vegetables or nuts. It is important that you exercise regularly at least  30 minutes 5 times a week. If you develop chest pain, have severe difficulty breathing, or feel very tired, stop exercising immediately and seek medical attention  Immunization reviewed and updated. Cancer screening reviewed and updated    Patient Instructions (the written plan) was given to the patient.  Medicare Attestation  I have personally reviewed:  The patient's  medical and social history  Their use of alcohol, tobacco or illicit drugs  Their current medications and supplements  The patient's functional ability including ADLs,fall risks, home safety risks, cognitive, and hearing and visual impairment  Diet and physical activities  Evidence for depression or mood disorders  The patient's weight, height, BMI, and visual acuity have been recorded in the chart. I have made referrals, counseling, and provided education to the patient based on review of the above and I have provided the patient with a written personalized care plan for preventive services.      Review of Systems     Objective:   Physical Exam Patient alert and oriented and in no cardiopulmonary distress.  HEENT: No facial asymmetry, EOMI, no sinus tenderness,  oropharynx pink and moist.  Neck supple no adenopathy.  Chest: Clear to auscultation bilaterally.  CVS: S1, S2 no murmurs, no S3.  ABD: Soft non tender. Bowel sounds normal.LUQ mass Rectal : no mass, heme negative stool  Ext: No edema  MS: Adequate ROM spine, shoulders, hips and knees.  Skin: Intact, no ulcerations or rash noted.  Psych: Good eye contact, normal affect. Memory intact not anxious or depressed appearing.  CNS: CN 2-12 intact, power, tone and sensation normal throughout.       Assessment & Plan:

## 2013-08-04 NOTE — Patient Instructions (Addendum)
F/u in 6 month, call if you need me before  Please consider joining senior citizens group  You are referred for a scan of your abdomen  Rectal exam today is normal  CBc, fasting lipid, cmp, TSH in 6 months please before visit  Tylenol is sent in for use as needed for back pain or headache limit to 3 per week please  NEED to start aspirin 81 mg once daily to reduce stroke risk

## 2013-08-05 NOTE — Telephone Encounter (Signed)
Called patient no answer.

## 2013-08-07 DIAGNOSIS — Z0000 Encounter for general adult medical examination without abnormal findings: Secondary | ICD-10-CM | POA: Insufficient documentation

## 2013-08-07 NOTE — Assessment & Plan Note (Signed)
Controlled, no change in medication  

## 2013-08-07 NOTE — Assessment & Plan Note (Signed)
Tylenol for as needed use

## 2013-08-07 NOTE — Assessment & Plan Note (Signed)
Referred for rept scan but she declined the test

## 2013-08-07 NOTE — Assessment & Plan Note (Signed)
Slightly undercorrected, doise adjustment already made

## 2013-08-07 NOTE — Assessment & Plan Note (Signed)
Annual wellness as documented. Pt somewhat socially isolated and is strongly encouraged to become involved in community. Outstanding is her abn abdominal scan suggestive of pancreatic disease refuses to pursue at this time Full code discussed and this is her wish

## 2013-08-09 NOTE — Telephone Encounter (Signed)
Patient aware.

## 2013-09-07 ENCOUNTER — Telehealth: Payer: Self-pay | Admitting: Family Medicine

## 2013-09-07 NOTE — Telephone Encounter (Signed)
Called pharmacy and confirmed that patient does have refills on both medicines.  They will fill for pickup

## 2013-11-28 ENCOUNTER — Other Ambulatory Visit: Payer: Self-pay

## 2013-11-28 ENCOUNTER — Telehealth: Payer: Self-pay

## 2013-11-28 MED ORDER — NIFEDIPINE ER 30 MG PO TB24
ORAL_TABLET | ORAL | Status: DC
Start: 2013-11-28 — End: 2014-02-21

## 2013-11-28 MED ORDER — HYDROCHLOROTHIAZIDE 25 MG PO TABS: ORAL_TABLET | ORAL | Status: DC

## 2013-11-28 MED ORDER — ENALAPRIL MALEATE 20 MG PO TABS: ORAL_TABLET | ORAL | Status: DC

## 2013-11-28 MED ORDER — OMEPRAZOLE 40 MG PO CPDR: 40.0000 mg | DELAYED_RELEASE_CAPSULE | Freq: Every day | ORAL | Status: DC

## 2013-11-28 NOTE — Telephone Encounter (Signed)
meds refilled 

## 2014-02-01 ENCOUNTER — Encounter (INDEPENDENT_AMBULATORY_CARE_PROVIDER_SITE_OTHER): Payer: Self-pay

## 2014-02-01 ENCOUNTER — Encounter: Payer: Self-pay | Admitting: Family Medicine

## 2014-02-01 ENCOUNTER — Ambulatory Visit (INDEPENDENT_AMBULATORY_CARE_PROVIDER_SITE_OTHER): Payer: Medicare Other | Admitting: Family Medicine

## 2014-02-01 VITALS — BP 130/74 | HR 58 | Resp 16 | Wt 131.1 lb

## 2014-02-01 DIAGNOSIS — R7989 Other specified abnormal findings of blood chemistry: Secondary | ICD-10-CM

## 2014-02-01 DIAGNOSIS — R002 Palpitations: Secondary | ICD-10-CM | POA: Insufficient documentation

## 2014-02-01 DIAGNOSIS — R5381 Other malaise: Secondary | ICD-10-CM

## 2014-02-01 DIAGNOSIS — I498 Other specified cardiac arrhythmias: Secondary | ICD-10-CM

## 2014-02-01 DIAGNOSIS — E785 Hyperlipidemia, unspecified: Secondary | ICD-10-CM

## 2014-02-01 DIAGNOSIS — B353 Tinea pedis: Secondary | ICD-10-CM | POA: Insufficient documentation

## 2014-02-01 DIAGNOSIS — R001 Bradycardia, unspecified: Secondary | ICD-10-CM

## 2014-02-01 DIAGNOSIS — R5383 Other fatigue: Secondary | ICD-10-CM

## 2014-02-01 DIAGNOSIS — E039 Hypothyroidism, unspecified: Secondary | ICD-10-CM

## 2014-02-01 DIAGNOSIS — I10 Essential (primary) hypertension: Secondary | ICD-10-CM

## 2014-02-01 DIAGNOSIS — S42309A Unspecified fracture of shaft of humerus, unspecified arm, initial encounter for closed fracture: Secondary | ICD-10-CM

## 2014-02-01 DIAGNOSIS — R945 Abnormal results of liver function studies: Secondary | ICD-10-CM

## 2014-02-01 DIAGNOSIS — R079 Chest pain, unspecified: Secondary | ICD-10-CM

## 2014-02-01 LAB — CBC WITH DIFFERENTIAL/PLATELET
Basophils Absolute: 0 10*3/uL (ref 0.0–0.1)
Basophils Relative: 1 % (ref 0–1)
Eosinophils Absolute: 0.2 10*3/uL (ref 0.0–0.7)
Eosinophils Relative: 4 % (ref 0–5)
HCT: 39.3 % (ref 36.0–46.0)
Hemoglobin: 13 g/dL (ref 12.0–15.0)
LYMPHS ABS: 1.2 10*3/uL (ref 0.7–4.0)
LYMPHS PCT: 27 % (ref 12–46)
MCH: 31.7 pg (ref 26.0–34.0)
MCHC: 33.1 g/dL (ref 30.0–36.0)
MCV: 95.9 fL (ref 78.0–100.0)
Monocytes Absolute: 0.3 10*3/uL (ref 0.1–1.0)
Monocytes Relative: 7 % (ref 3–12)
NEUTROS ABS: 2.8 10*3/uL (ref 1.7–7.7)
NEUTROS PCT: 61 % (ref 43–77)
PLATELETS: 308 10*3/uL (ref 150–400)
RBC: 4.1 MIL/uL (ref 3.87–5.11)
RDW: 14.5 % (ref 11.5–15.5)
WBC: 4.6 10*3/uL (ref 4.0–10.5)

## 2014-02-01 LAB — LIPID PANEL
Cholesterol: 294 mg/dL — ABNORMAL HIGH (ref 0–200)
HDL: 81 mg/dL (ref >39)
LDL CALC: 184 mg/dL — AB (ref 0–99)
Total CHOL/HDL Ratio: 3.6 Ratio
Triglycerides: 144 mg/dL (ref <150)
VLDL: 29 mg/dL (ref 0–40)

## 2014-02-01 LAB — COMPREHENSIVE METABOLIC PANEL
ALBUMIN: 4.6 g/dL (ref 3.5–5.2)
ALT: 14 U/L (ref 0–35)
AST: 22 U/L (ref 0–37)
Alkaline Phosphatase: 122 U/L — ABNORMAL HIGH (ref 39–117)
BUN: 16 mg/dL (ref 6–23)
CALCIUM: 10.7 mg/dL — AB (ref 8.4–10.5)
CHLORIDE: 103 meq/L (ref 96–112)
CO2: 27 meq/L (ref 19–32)
Creat: 0.94 mg/dL (ref 0.50–1.10)
Glucose, Bld: 92 mg/dL (ref 70–99)
POTASSIUM: 4.1 meq/L (ref 3.5–5.3)
SODIUM: 140 meq/L (ref 135–145)
TOTAL PROTEIN: 7.6 g/dL (ref 6.0–8.3)
Total Bilirubin: 0.6 mg/dL (ref 0.2–1.2)

## 2014-02-01 MED ORDER — TERBINAFINE HCL 250 MG PO TABS: 250.0000 mg | ORAL_TABLET | Freq: Every day | ORAL | Status: DC

## 2014-02-01 MED ORDER — CLOTRIMAZOLE-BETAMETHASONE 1-0.05 % EX CREA: 1.0000 "application " | TOPICAL_CREAM | Freq: Two times a day (BID) | CUTANEOUS | Status: DC

## 2014-02-01 NOTE — Patient Instructions (Addendum)
Complete exam in 4 month, call if you need me before  EKG in office today and referral to cardiology to evaluate palitations and new exertional fatigue  TSH, lipid , cmp and cbc today    Antibiotic ointment will be given to apply to left heel where you picked the skin off, twice daily for 5 days.  For right heel where you have the thick rash, terbinafine tablets for 2 weeks are prescribed and also cream to be applied twice daily  Reconsider mammogram please

## 2014-02-01 NOTE — Progress Notes (Signed)
   Subjective:    Patient ID: Tina Goodwin, female    DOB: 1932/06/06, 80 y.o.   MRN: [id number]  HPI The PT is here for follow up and re-evaluation of chronic medical conditions, medication management and review of any available recent lab and radiology data.  Preventive health is updated, specifically  Cancer screening and Immunization.    The PT denies any adverse reactions to current medications since the last visit.  2 month h/o heart racing and increased exertional fatigue, denies  PND orthopnea or leg swelling.Reports intermittent chest discomfort, no specifically related to activity Sores on posterior feet right greater than left, she has pulled skin off of her left heel, wants medication for this Still no desire to f/u pancreatic mass      Review of Systems See HPI Denies recent fever or chills. Denies sinus pressure, nasal congestion, ear pain or sore throat. Denies chest congestion, productive cough or wheezing.  Denies abdominal pain, nausea, vomiting,diarrhea or constipation.   Denies dysuria, frequency, hesitancy or incontinence. Denies joint pain, swelling and limitation in mobility. Denies headaches, seizures, numbness, or tingling. Denies depression, anxiety or insomnia.         Objective:   Physical Exam  BP 130/74  Pulse 58  Resp 16  Wt 131 lb 1.9 oz (59.476 kg)  SpO2 95% Patient alert and oriented and in no cardiopulmonary distress.  HEENT: No facial asymmetry, EOMI, no sinus tenderness,  oropharynx pink and moist.  Neck supple no adenopathy.  Chest: Clear to auscultation bilaterally.No reproducible chest wall pain  CVS: S1, S2 no murmurs, no S3. EKG; sinus bradycardia , no ischemia  ABD: Soft non tender. Bowel sounds normal.  Ext: No edema  MS: Adequate ROM spine, shoulders, hips and knees.  Skin: Intact, no ulcerations or rash noted.  Psych: Good eye contact, normal affect. Memory intact not anxious or depressed appearing.  CNS:  CN 2-12 intact, power, tone and sensation normal throughout.       Assessment & Plan:  Palpitations 2 month h/o  exertion al fatigue with palpitations, EKG in office and  cardiology referral  Symptomatic sinus bradycardia 2 month h/o increased exertio nal fatigue and feeling irregularity in heart rate, refer to cardiology  HYPERLIPIDEMIA Uncontrolled, add statin Hyperlipidemia:Low fat diet discussed and encouraged.recheck hepatic panel 6 weeks after starting sttain  Updated lab needed at/ before next visit.   Tinea pedis of right foot Topical med and 2 week course of oral med Thick scaly lesion on heel of foot, ulcer on left foot where pt picked her skin  HYPOTHYROIDISM Controlled, no change in medication   HYPERTENSION Controlled, no change in medication   FRACTURE, ARM, RIGHT Needs dexa, and should nbe on bisphosphonate therapy will encourage pt to start this, will need to contact her after the visit  Chest pain 2 month h/o intermittent chest discomfort associated with exertional  fatigue , EKG shows sinus braadycardia, no ischemic changes noted

## 2014-02-01 NOTE — Assessment & Plan Note (Addendum)
2 month h/o  exertion al fatigue with palpitations, EKG in office and  cardiology referral

## 2014-02-02 LAB — TSH: TSH: 1.544 u[IU]/mL (ref 0.350–4.500)

## 2014-02-04 ENCOUNTER — Telehealth: Payer: Self-pay | Admitting: Family Medicine

## 2014-02-04 DIAGNOSIS — R001 Bradycardia, unspecified: Secondary | ICD-10-CM | POA: Insufficient documentation

## 2014-02-04 DIAGNOSIS — R0789 Other chest pain: Secondary | ICD-10-CM | POA: Insufficient documentation

## 2014-02-04 NOTE — Assessment & Plan Note (Signed)
2 month h/o intermittent chest discomfort associated with exertional  fatigue , EKG shows sinus braadycardia, no ischemic changes noted

## 2014-02-04 NOTE — Assessment & Plan Note (Signed)
Controlled, no change in medication  

## 2014-02-04 NOTE — Assessment & Plan Note (Signed)
Uncontrolled, add statin Hyperlipidemia:Low fat diet discussed and encouraged.recheck hepatic panel 6 weeks after starting sttain  Updated lab needed at/ before next visit.

## 2014-02-04 NOTE — Telephone Encounter (Signed)
Please explain the need to take medication to strengthen bones.  If she agrees please  Send in a script for either fosamax 70 mg once weekly x 12 month total, or actonel 35 mg once weekly x 12 month total, whichever is covered , if she agrees. Also needs to be on calcium 1000 to 1200 mg daily, and vitamin D3 600 to 800 IU daily, for bone health. .Please also explain,  That the calcium and Vitamin D3 are  obtained over the counter, and not by prescription.

## 2014-02-04 NOTE — Assessment & Plan Note (Signed)
2 month h/o increased exertio nal fatigue and feeling irregularity in heart rate, refer to cardiology

## 2014-02-04 NOTE — Assessment & Plan Note (Signed)
Topical med and 2 week course of oral med Thick scaly lesion on heel of foot, ulcer on left foot where pt picked her skin

## 2014-02-04 NOTE — Assessment & Plan Note (Signed)
Needs dexa, and should nbe on bisphosphonate therapy will encourage pt to start this, will need to contact her after the visit

## 2014-02-06 MED ORDER — ATORVASTATIN CALCIUM 10 MG PO TABS: 10.0000 mg | ORAL_TABLET | Freq: Every day | ORAL | Status: DC

## 2014-02-06 MED ORDER — ALENDRONATE SODIUM 70 MG PO TABS: 70.0000 mg | ORAL_TABLET | ORAL | Status: DC

## 2014-02-06 NOTE — Telephone Encounter (Signed)
Patient aware and states that she will try medicine.   Fosamax prescribed and sent to pharmacy.

## 2014-02-06 NOTE — Addendum Note (Signed)
Addended by: Denman George B on: 02/06/2014 10:21 AM   Modules accepted: Orders

## 2014-02-06 NOTE — Addendum Note (Signed)
Addended by: Denman George B on: 02/06/2014 10:19 AM   Modules accepted: Orders

## 2014-02-21 ENCOUNTER — Telehealth: Payer: Self-pay | Admitting: Family Medicine

## 2014-02-21 MED ORDER — NIFEDIPINE ER 30 MG PO TB24: ORAL_TABLET | ORAL | Status: DC

## 2014-02-21 MED ORDER — EZETIMIBE 10 MG PO TABS: 10.0000 mg | ORAL_TABLET | Freq: Every day | ORAL | Status: DC

## 2014-02-21 NOTE — Telephone Encounter (Signed)
meds sent

## 2014-03-06 ENCOUNTER — Encounter: Payer: Self-pay | Admitting: Cardiology

## 2014-03-06 ENCOUNTER — Ambulatory Visit (HOSPITAL_COMMUNITY)
Admission: RE | Admit: 2014-03-06 | Discharge: 2014-03-06 | Disposition: A | Discharge status: Home / Self Care | Payer: Medicare Other | Source: Ambulatory Visit | Attending: Cardiology | Admitting: Cardiology

## 2014-03-06 ENCOUNTER — Ambulatory Visit (INDEPENDENT_AMBULATORY_CARE_PROVIDER_SITE_OTHER): Payer: Medicare Other | Admitting: Cardiology

## 2014-03-06 ENCOUNTER — Other Ambulatory Visit: Payer: Self-pay

## 2014-03-06 VITALS — BP 144/78 | HR 74 | Ht 66.0 in | Wt 131.0 lb

## 2014-03-06 DIAGNOSIS — R002 Palpitations: Secondary | ICD-10-CM

## 2014-03-06 DIAGNOSIS — I369 Nonrheumatic tricuspid valve disorder, unspecified: Secondary | ICD-10-CM

## 2014-03-06 DIAGNOSIS — E785 Hyperlipidemia, unspecified: Secondary | ICD-10-CM | POA: Insufficient documentation

## 2014-03-06 DIAGNOSIS — R0602 Shortness of breath: Secondary | ICD-10-CM | POA: Insufficient documentation

## 2014-03-06 DIAGNOSIS — J4489 Other specified chronic obstructive pulmonary disease: Secondary | ICD-10-CM | POA: Insufficient documentation

## 2014-03-06 DIAGNOSIS — Z87891 Personal history of nicotine dependence: Secondary | ICD-10-CM | POA: Insufficient documentation

## 2014-03-06 DIAGNOSIS — J449 Chronic obstructive pulmonary disease, unspecified: Secondary | ICD-10-CM | POA: Insufficient documentation

## 2014-03-06 DIAGNOSIS — I10 Essential (primary) hypertension: Secondary | ICD-10-CM | POA: Insufficient documentation

## 2014-03-06 MED ORDER — HYDROCHLOROTHIAZIDE 25 MG PO TABS: ORAL_TABLET | ORAL | Status: DC

## 2014-03-06 MED ORDER — LEVOTHYROXINE SODIUM 50 MCG PO TABS: 50.0000 ug | ORAL_TABLET | Freq: Every day | ORAL | Status: DC

## 2014-03-06 MED ORDER — ENALAPRIL MALEATE 20 MG PO TABS: ORAL_TABLET | ORAL | Status: DC

## 2014-03-06 NOTE — Progress Notes (Signed)
Clinical Summary Tina Goodwin is a 80 y.o.female referred for cardiology consultation by Dr. Moshe Cipro. She reports a one-year history of intermittent palpitations. Typically when she is active, working outdoors with her flowers, she can feel a sensation of fluttering, points to her epigastric to lower sternal area. She states that this makes her uncomfortable, short of breath, somewhat dizzy. She usually sits down and the symptoms go away within 5 minutes or so. She has never had syncope with this.  Lab work from March showed TSH 1.5, cholesterol 194, triglycerides 144, HDL 81, LDL 184, potassium 4.1, BUN 16, creatinine 0.9, normal AST and ALT, hemoglobin 13.0, platelets 308.  I reviewed her recent ECG from March finding sinus bradycardia with decreased R-wave progression, leftward axis. Repeat tracing today shows normal sinus rhythm with sinus arrhythmia, decreased R-wave progression, normal axis. She does have small R' in leads V1 and V2.  She reports stable antihypertensive regimen. Only new medication is Lipitor.   No Known Allergies  Current Outpatient Prescriptions  Medication Sig Dispense Refill  . acetaminophen (TYLENOL) 325 MG tablet One tablet up to 3 times per week , for back pain or headache  40 tablet  1  . atorvastatin (LIPITOR) 10 MG tablet Take 1 tablet (10 mg total) by mouth daily.  90 tablet  1  . clotrimazole-betamethasone (LOTRISONE) cream Apply 1 application topically 2 (two) times daily.  45 g  0  . enalapril (VASOTEC) 20 MG tablet 2 tabs daily  180 tablet  1  . ezetimibe (ZETIA) 10 MG tablet Take 1 tablet (10 mg total) by mouth daily.  90 tablet  1  . Ferrous Fumarate 324 MG TABS Take 1 tablet (106 mg of iron total) by mouth 2 (two) times daily.  60 each  3  . hydrochlorothiazide (HYDRODIURIL) 25 MG tablet TAKE 1 TABLET BY MOUTH EVERY DAY  90 tablet  1  . levothyroxine (SYNTHROID, LEVOTHROID) 50 MCG tablet Take 1 tablet (50 mcg total) by mouth daily. Take 1/2 mon wed  and fri and 1 tab all other days  90 tablet  1  . NIFEdipine (PROCARDIA-XL/ADALAT CC) 30 MG 24 hr tablet TAKE 1 TABLET BY MOUTH EVERY DAY  90 tablet  1   No current facility-administered medications for this visit.    Past Medical History  Diagnosis Date  . Allergic rhinitis   . Depression   . Hyperlipidemia   . Uncontrolled stage 2 hypertension   . Numbness of foot     Bilateral   . Cholecystitis chronic   . Right forearm fracture   . Dysuria   . Anemia, iron deficiency   . Hypothyroidism   . Renal mass   . NASH (nonalcoholic steatohepatitis)   . COPD (chronic obstructive pulmonary disease)     Past Surgical History  Procedure Laterality Date  . Vesicovaginal fistula closure w/ tah    . Thyroidectomy    . Back surgery      x2  . Right cataract extraction    . Left cataract extraction    . Eye surgery Left     [facility]  . Eye surgery Right     Family History  Problem Relation Age of Onset  . Cancer Mother   . Hypertension Mother   . Cancer Father   . Hypertension Father     Social History Tina Goodwin reports that she has quit smoking. Her smoking use included Cigarettes. She smoked 0.00 packs per day. She does not  have any smokeless tobacco history on file. Tina Goodwin reports that she does not drink alcohol.  Review of Systems Occasional arthritic pains. No sudden syncope. No orthopnea or PND. No leg edema. Otherwise negative except as outlined.  Physical Examination Filed Vitals:   03/06/14 0834  BP: 144/78  Pulse: 74   Filed Weights   03/06/14 0834  Weight: 131 lb (59.421 kg)   Patient appears comfortable at rest. HEENT: Conjunctiva and lids normal, oropharynx clear. Neck: Supple, no elevated JVP or carotid bruits, no thyromegaly. Lungs: Clear to auscultation, nonlabored breathing at rest. Cardiac: Regular rate and rhythm, no S3, very soft systolic murmur, no pericardial rub. Abdomen: Soft, nontender, bowel sounds present. Extremities:  No pitting edema, distal pulses 2+. Skin: Warm and dry. Musculoskeletal: No kyphosis. Arthritic deformities of digits. Neuropsychiatric: Alert and oriented x3, affect grossly appropriate.   Problem List and Plan   Palpitations Seem to be mostly exertional, associated with shortness of breath and dizziness, but never syncope. This has been long-standing, at least over the last year. ECG is overall nonspecific. Recent TSH was normal. No new medications that would affect heart rate. I have recommended proceeding with a seven-day cardiac monitor, she reports symptoms usually once or twice a week. We will see her back for followup.  Shortness of breath Mainly associated with palpitations as outlined. Echocardiogram will also be obtained to assess cardiac structure and function.    Satira Sark, M.D., F.A.C.C.

## 2014-03-06 NOTE — Patient Instructions (Signed)
Your physician recommends that you schedule a follow-up appointment in: 3-4 weeks    Your physician has recommended that you wear a 7 day event monitor. Event monitors are medical devices that record the heart's electrical activity. Doctors most often [place] these monitors to diagnose arrhythmias. Arrhythmias are problems with the speed or rhythm of the heartbeat. The monitor is a small, portable device. You can wear one while you do your normal daily activities. This is usually used to diagnose what is causing palpitations/syncope (passing out).   Your physician has requested that you have an echocardiogram. Echocardiography is a painless test that uses sound waves to create images of your heart. It provides your doctor with information about the size and shape of your heart and how well your heart's chambers and valves are working. This procedure takes approximately one hour. There are no restrictions for this procedure.   Your physician recommends that you continue on your current medications as directed. Please refer to the Current Medication list given to you today.

## 2014-03-06 NOTE — Progress Notes (Signed)
Patient declined event monitor,even offered 24 hr monitor and she stated she was too anxious  Dr.McDowell made aware

## 2014-03-06 NOTE — Addendum Note (Signed)
Addended by: Barbarann Ehlers A on: 03/06/2014 09:36 AM   Modules accepted: Orders

## 2014-03-06 NOTE — Assessment & Plan Note (Signed)
Seem to be mostly exertional, associated with shortness of breath and dizziness, but never syncope. This has been long-standing, at least over the last year. ECG is overall nonspecific. Recent TSH was normal. No new medications that would affect heart rate. I have recommended proceeding with a seven-day cardiac monitor, she reports symptoms usually once or twice a week. We will see her back for followup.

## 2014-03-06 NOTE — Addendum Note (Signed)
Addended by: Barbarann Ehlers A on: 03/06/2014 09:25 AM   Modules accepted: Orders

## 2014-03-06 NOTE — Progress Notes (Signed)
*  PRELIMINARY RESULTS* Echocardiogram 2D Echocardiogram has been performed.  Tina Goodwin 03/06/2014, 10:14 AM

## 2014-03-06 NOTE — Assessment & Plan Note (Signed)
Mainly associated with palpitations as outlined. Echocardiogram will also be obtained to assess cardiac structure and function.

## 2014-03-23 NOTE — Addendum Note (Signed)
Addended by: Barbarann Ehlers A on: 03/23/2014 01:41 PM   Modules accepted: Orders

## 2014-03-31 ENCOUNTER — Encounter: Payer: Medicare Other | Admitting: Cardiology

## 2014-03-31 ENCOUNTER — Encounter: Payer: Self-pay | Admitting: *Deleted

## 2014-03-31 ENCOUNTER — Encounter: Payer: Self-pay | Admitting: Cardiology

## 2014-03-31 NOTE — Progress Notes (Signed)
NNo-show. This encounter was created in error - please disregard. 

## 2014-04-04 ENCOUNTER — Encounter: Payer: Self-pay | Admitting: *Deleted

## 2014-05-17 ENCOUNTER — Ambulatory Visit (INDEPENDENT_AMBULATORY_CARE_PROVIDER_SITE_OTHER): Payer: Medicare Other | Admitting: Family Medicine

## 2014-05-17 ENCOUNTER — Encounter: Payer: Self-pay | Admitting: Family Medicine

## 2014-05-17 ENCOUNTER — Other Ambulatory Visit (HOSPITAL_COMMUNITY)
Admission: RE | Admit: 2014-05-17 | Discharge: 2014-05-17 | Disposition: A | Discharge status: Home / Self Care | Payer: Medicare Other | Source: Ambulatory Visit | Attending: Family Medicine | Admitting: Family Medicine

## 2014-05-17 VITALS — BP 130/70 | HR 50 | Resp 16 | Ht 65.0 in | Wt 127.1 lb

## 2014-05-17 DIAGNOSIS — E785 Hyperlipidemia, unspecified: Secondary | ICD-10-CM

## 2014-05-17 DIAGNOSIS — E039 Hypothyroidism, unspecified: Secondary | ICD-10-CM

## 2014-05-17 DIAGNOSIS — Z0000 Encounter for general adult medical examination without abnormal findings: Secondary | ICD-10-CM

## 2014-05-17 DIAGNOSIS — Z1151 Encounter for screening for human papillomavirus (HPV): Secondary | ICD-10-CM | POA: Insufficient documentation

## 2014-05-17 DIAGNOSIS — M949 Disorder of cartilage, unspecified: Secondary | ICD-10-CM

## 2014-05-17 DIAGNOSIS — R87810 Cervical high risk human papillomavirus (HPV) DNA test positive: Secondary | ICD-10-CM | POA: Insufficient documentation

## 2014-05-17 DIAGNOSIS — R5383 Other fatigue: Secondary | ICD-10-CM

## 2014-05-17 DIAGNOSIS — I10 Essential (primary) hypertension: Secondary | ICD-10-CM

## 2014-05-17 DIAGNOSIS — R5381 Other malaise: Secondary | ICD-10-CM

## 2014-05-17 DIAGNOSIS — R634 Abnormal weight loss: Secondary | ICD-10-CM | POA: Insufficient documentation

## 2014-05-17 DIAGNOSIS — Z1211 Encounter for screening for malignant neoplasm of colon: Secondary | ICD-10-CM

## 2014-05-17 DIAGNOSIS — M899 Disorder of bone, unspecified: Secondary | ICD-10-CM

## 2014-05-17 DIAGNOSIS — R002 Palpitations: Secondary | ICD-10-CM

## 2014-05-17 DIAGNOSIS — K8010 Calculus of gallbladder with chronic cholecystitis without obstruction: Secondary | ICD-10-CM

## 2014-05-17 DIAGNOSIS — D509 Iron deficiency anemia, unspecified: Secondary | ICD-10-CM

## 2014-05-17 DIAGNOSIS — Z124 Encounter for screening for malignant neoplasm of cervix: Secondary | ICD-10-CM

## 2014-05-17 DIAGNOSIS — K869 Disease of pancreas, unspecified: Secondary | ICD-10-CM

## 2014-05-17 DIAGNOSIS — R0602 Shortness of breath: Secondary | ICD-10-CM

## 2014-05-17 DIAGNOSIS — K8689 Other specified diseases of pancreas: Secondary | ICD-10-CM

## 2014-05-17 LAB — COMPLETE METABOLIC PANEL WITH GFR
ALBUMIN: 4.7 g/dL (ref 3.5–5.2)
ALT: 21 U/L (ref 0–35)
AST: 26 U/L (ref 0–37)
Alkaline Phosphatase: 113 U/L (ref 39–117)
BUN: 27 mg/dL — ABNORMAL HIGH (ref 6–23)
CHLORIDE: 108 meq/L (ref 96–112)
CO2: 23 mEq/L (ref 19–32)
CREATININE: 1.28 mg/dL — AB (ref 0.50–1.10)
Calcium: 11.1 mg/dL — ABNORMAL HIGH (ref 8.4–10.5)
GFR, Est African American: 46 mL/min — ABNORMAL LOW
GFR, Est Non African American: 40 mL/min — ABNORMAL LOW
Glucose, Bld: 95 mg/dL (ref 70–99)
POTASSIUM: 4.5 meq/L (ref 3.5–5.3)
Sodium: 141 mEq/L (ref 135–145)
Total Bilirubin: 0.8 mg/dL (ref 0.2–1.2)
Total Protein: 7.7 g/dL (ref 6.0–8.3)

## 2014-05-17 LAB — LIPID PANEL
CHOL/HDL RATIO: 2.7 ratio
Cholesterol: 197 mg/dL (ref 0–200)
HDL: 72 mg/dL (ref >39)
LDL CALC: 105 mg/dL — AB (ref 0–99)
TRIGLYCERIDES: 99 mg/dL (ref <150)
VLDL: 20 mg/dL (ref 0–40)

## 2014-05-17 LAB — HEMOGLOBIN: Hemoglobin: 12.8 g/dL (ref 12.0–15.0)

## 2014-05-17 NOTE — Patient Instructions (Addendum)
F/u in 3 month, call if you need me before  You are referred for  Abdominal and pelvic CT scan with contrast to re evaluate your problems there, now that you realize and accept that you NEED this done  You are referred to the GI specialist for evaluation with colonoscopy  You need a CXr the day you go to get your abdominal scan, this is already ordered  Labs today, cmp and EGFR, lipids and TSH and vit D, iron , and HB  Since you do not see well, you really need to be extremely careful so you  Do not fall  I need name and number for one of your children, since you have so many health problems , also your signed permission to discuss your health problems with her  Fall Prevention and Home Safety Falls cause injuries and can affect all age groups. It is possible to prevent falls.  HOW TO PREVENT FALLS  Wear shoes with rubber soles that do not have an opening for your toes.  Keep the inside and outside of your house well lit.  Use night lights throughout your home.  Remove clutter from floors.  Clean up floor spills.  Remove throw rugs or fasten them to the floor with carpet tape.  Do not place electrical cords across pathways.  Put grab bars by your tub, shower, and toilet. Do not use towel bars as grab bars.  Put handrails on both sides of the stairway. Fix loose handrails.  Do not climb on stools or stepladders, if possible.  Do not wax your floors.  Repair uneven or unsafe sidewalks, walkways, or stairs.  Keep items you use a lot within reach.  Be aware of pets.  Keep emergency numbers next to the telephone.  Put smoke detectors in your home and near bedrooms. Ask your doctor what other things you can do to prevent falls. Document Released: 08/30/2009 Document Revised: 05/04/2012 Document Reviewed: 02/03/2012 [facility] Patient Information 2015 [place], [place]. This information is not intended to replace advice given to you by your health care provider. Make sure you  discuss any questions you have with your health care provider.

## 2014-05-18 LAB — VITAMIN D 25 HYDROXY (VIT D DEFICIENCY, FRACTURES): Vit D, 25-Hydroxy: 31 ng/mL (ref 30–89)

## 2014-05-18 LAB — TSH: TSH: 1.631 u[IU]/mL (ref 0.350–4.500)

## 2014-05-18 LAB — FERRITIN: FERRITIN: 61 ng/mL (ref 10–291)

## 2014-05-22 ENCOUNTER — Other Ambulatory Visit: Payer: Self-pay | Admitting: Family Medicine

## 2014-05-22 ENCOUNTER — Telehealth: Payer: Self-pay | Admitting: Family Medicine

## 2014-05-22 ENCOUNTER — Ambulatory Visit (HOSPITAL_COMMUNITY)
Admission: RE | Admit: 2014-05-22 | Discharge: 2014-05-22 | Disposition: A | Discharge status: Home / Self Care | Payer: Medicare Other | Source: Ambulatory Visit | Attending: Family Medicine | Admitting: Family Medicine

## 2014-05-22 DIAGNOSIS — Z85850 Personal history of malignant neoplasm of thyroid: Secondary | ICD-10-CM | POA: Insufficient documentation

## 2014-05-22 DIAGNOSIS — I10 Essential (primary) hypertension: Secondary | ICD-10-CM

## 2014-05-22 DIAGNOSIS — I700 Atherosclerosis of aorta: Secondary | ICD-10-CM | POA: Insufficient documentation

## 2014-05-22 DIAGNOSIS — R634 Abnormal weight loss: Secondary | ICD-10-CM | POA: Insufficient documentation

## 2014-05-22 DIAGNOSIS — K7689 Other specified diseases of liver: Secondary | ICD-10-CM | POA: Insufficient documentation

## 2014-05-22 DIAGNOSIS — K8020 Calculus of gallbladder without cholecystitis without obstruction: Secondary | ICD-10-CM

## 2014-05-22 DIAGNOSIS — K869 Disease of pancreas, unspecified: Secondary | ICD-10-CM | POA: Insufficient documentation

## 2014-05-22 DIAGNOSIS — K8689 Other specified diseases of pancreas: Secondary | ICD-10-CM | POA: Insufficient documentation

## 2014-05-22 DIAGNOSIS — R188 Other ascites: Secondary | ICD-10-CM | POA: Insufficient documentation

## 2014-05-22 LAB — CYTOLOGY - PAP

## 2014-05-22 MED ORDER — IOHEXOL 300 MG/ML  SOLN
100.0000 mL | Freq: Once | INTRAMUSCULAR | Status: AC | PRN
  Administered 2014-05-22: 100 mL via INTRAVENOUS

## 2014-05-22 NOTE — Telephone Encounter (Signed)
pls see referrals for this pt to see Dr Gala Romney  For diagnostic colonoscopy also with regard to pancreatic lesion, I have also sent him a "flag" on her, she is now "ready to go", and NEEDS this She will need to see a surgeon re gallstones AFTER GI has finished with her, I will let you know/ enter the referral at that time. If a referral is entered by nurse after she talks with pt, pls do not schedule appt for her to be seen  before mid August as she needs GI eval to be completed first, thanks  ??? pls ask

## 2014-05-22 NOTE — Assessment & Plan Note (Addendum)
Annual physical  exam as documented.  Immunization and cancer screening needs are specifically addressed at this visit. Pt now agrees to have colonoscopy which she has consistently refused in the past, however  based on her c/o abdominal pain, weight loss, early satiety and change  in stool she staes "something needs to be done, and I am ready now"

## 2014-05-22 NOTE — Assessment & Plan Note (Signed)
Abnormal CT scan, suggestive of possible cancer, needs updated film, pt agrees that  she will have this done

## 2014-05-22 NOTE — Assessment & Plan Note (Signed)
Still experiencing palpitations, hs not yet applied Holter monitor recommended by cardiologist, i have encouraged her strongly to do so, still uncomitted to follow through on this at this time however

## 2014-05-22 NOTE — Assessment & Plan Note (Signed)
C/o fatigue and shortness of breath with activity, needs CXR to further evaluate

## 2014-05-22 NOTE — Assessment & Plan Note (Signed)
Symptomatic , with early satiety and weight loss, will likely benefit from cholecystectomy, will need to have full GI eval first

## 2014-05-22 NOTE — Progress Notes (Signed)
Subjective:    Patient ID: Tina Goodwin, female    DOB: Aug 04, 1932, 80 y.o.   MRN: [id number]  HPI Patient is in for annual physical exam.  She has concerns about abdominal pain and early satiety, poor appetite and weight loss with a change  In bowel movement, now decided to get a colonoscopy, also willing to have abdominal scan to re evaluate her pancreatic lesion seen initially approx 2.5 years ago Though her vision is very poor she is planning to hold off on re eval of this Still having palpitations, but "afraid" to wear holter monitor, I explained that there is npo danger with this, and it is needed to see how best to manage/treat her irregular heart rate, she will reconsider this     Review of Systems See HPI       Objective:   Physical Exam BP 130/70  Pulse 50  Resp 16  Ht 5\' 5"  (1.651 m)  Wt 127 lb 1.9 oz (57.661 kg)  BMI 21.15 kg/m2  SpO2 99% Pleasant under nourished female, alert and oriented x 3, in no cardio-pulmonary distress. Afebrile. HEENT No facial trauma or asymetry. Sinuses non tender.  EOMI, PERTL, External ears normal, tympanic membranes clear. Oropharynx moist, no exudate, poor dentition. Neck: decreased ROM though adequate, no adenopathy,JVD or thyromegaly.No bruits.  Chest: Clear to ascultation bilaterally.No crackles or wheezes. Non tender to palpation  Breast: No asymetry,no masses or lumps. No tenderness. No nipple discharge or inversion. No axillary or supraclavicular adenopathy  Cardiovascular system; Heart sounds normal,  S1 and  S2 ,no S3.  No murmur, or thrill.Irregular heart rate Apical beat not displaced Peripheral pulses normal.  Abdomen: Soft, mild epigastric and RUQ tenderness, no guarding or rebound.Possible RUQ  mass No bruits. Bowel sounds normal. No guarding, tenderness or rebound.  Rectal:  Normal sphincter tone. No mass..  Guaiac negative stool.  GU: External genitalia normal female genitalia , female  distribution of hair. No lesions. Urethral meatus normal in size, no  Prolapse, no lesions visibly  Present. Bladder non tender. Vagina pink and moist , with no visible lesions , no  discharge present . Adequate pelvic support no  cystocele or rectocele noted Uterus is absent   no adnexal masses, no  adnexal tenderness.   Musculoskeletal exam: Full ROM of spine, hips , shoulders and knees. No deformity ,swelling or crepitus noted. No muscle wasting or atrophy.   Neurologic: Cranial nerves 2 to 12 intact. Power, tone ,sensation and reflexes normal throughout. No disturbance in gait. No tremor.  Skin: Intact, no ulceration, erythema , scaling or rash noted. Pigmentation normal throughout  Psych; Normal mood and affect. Judgement and concentration normal        Assessment & Plan:  Routine general medical examination at a health care facility Annual physical  exam as documented.  Immunization and cancer screening needs are specifically addressed at this visit. Pt now agrees to have colonoscopy which she has consistently refused in the past, however  based on her c/o abdominal pain, weight loss, early satiety and change  in stool she staes "something needs to be done, and I am ready now"   Cholelithiasis Symptomatic , with early satiety and weight loss, will likely benefit from cholecystectomy, will need to have full GI eval first  Pancreatic lesion Abnormal CT scan, suggestive of possible cancer, needs updated film, pt agrees that  she will have this done  Shortness of breath C/o fatigue and shortness of breath with activity, needs  CXR to further evaluate  Palpitations Still experiencing palpitations, hs not yet applied Holter monitor recommended by cardiologist, i have encouraged her strongly to do so, still uncomitted to follow through on this at this time however

## 2014-05-23 ENCOUNTER — Encounter: Payer: Self-pay | Admitting: Gastroenterology

## 2014-05-24 NOTE — Addendum Note (Signed)
Addended by: Denman George B on: 05/24/2014 11:16 AM   Modules accepted: Orders

## 2014-05-25 ENCOUNTER — Ambulatory Visit: Payer: Medicare Other | Admitting: Gastroenterology

## 2014-05-25 NOTE — Addendum Note (Signed)
Addended by: Denman George B on: 05/25/2014 05:13 PM   Modules accepted: Orders

## 2014-05-26 LAB — MAGNESIUM: Magnesium: 1.6 mg/dL (ref 1.5–2.5)

## 2014-05-29 LAB — PTH, INTACT AND CALCIUM
CALCIUM: 10.7 mg/dL — AB (ref 8.4–10.5)
PTH: 155.4 pg/mL — ABNORMAL HIGH (ref 14.0–72.0)

## 2014-05-30 ENCOUNTER — Other Ambulatory Visit: Payer: Self-pay | Admitting: Family Medicine

## 2014-05-30 DIAGNOSIS — E213 Hyperparathyroidism, unspecified: Secondary | ICD-10-CM

## 2014-05-31 ENCOUNTER — Encounter: Payer: Self-pay | Admitting: Gastroenterology

## 2014-05-31 ENCOUNTER — Ambulatory Visit (INDEPENDENT_AMBULATORY_CARE_PROVIDER_SITE_OTHER): Payer: Medicare Other | Admitting: Gastroenterology

## 2014-05-31 ENCOUNTER — Telehealth: Payer: Self-pay

## 2014-05-31 VITALS — BP 156/76 | HR 52 | Temp 97.5°F | Resp 18 | Ht 67.0 in | Wt 125.0 lb

## 2014-05-31 DIAGNOSIS — K862 Cyst of pancreas: Secondary | ICD-10-CM

## 2014-05-31 DIAGNOSIS — K869 Disease of pancreas, unspecified: Secondary | ICD-10-CM

## 2014-05-31 DIAGNOSIS — K8689 Other specified diseases of pancreas: Secondary | ICD-10-CM

## 2014-05-31 DIAGNOSIS — Z1211 Encounter for screening for malignant neoplasm of colon: Secondary | ICD-10-CM

## 2014-05-31 MED ORDER — PREDNISONE 5 MG PO TABS: 5.0000 mg | ORAL_TABLET | Freq: Two times a day (BID) | ORAL | Status: DC

## 2014-05-31 MED ORDER — PEG 3350-KCL-NA BICARB-NACL 420 G PO SOLR: 4000.0000 mL | ORAL | Status: DC

## 2014-05-31 MED ORDER — INDOMETHACIN 25 MG PO CAPS: ORAL_CAPSULE | ORAL | Status: DC

## 2014-05-31 NOTE — Patient Instructions (Signed)
We have referred you for a procedure in [place] called an endoscopic ultrasound with Dr. Ardis Hughs. We need to have a better idea of what is going on in your pancreas. They will take a biopsy at that time.  We have also scheduled you for a routine screening colonoscopy with Dr. Gala Romney in the near future.  Please call Dr. Moshe Cipro about your ankle.

## 2014-05-31 NOTE — Progress Notes (Signed)
.    Primary Care Physician:  Tula Nakayama, MD Primary Gastroenterologist:  Dr. Gala Romney   Chief Complaint  Patient presents with  . Referral    HPI:   Tina Goodwin is a pleasant 80 year old female who presents today at the request of Dr. Moshe Cipro secondary to a pancreatic lesion in the pancreatic head, increased since 2012. Question of pseudocyst related to prior/chronic pancreatitis, less likely cystadenoma or side branch IPMN. She has had no prior EGD or colonoscopy. Notes intermittent tenesmus. BM usually every day after cup of coffee but takes awhile to come out. Soft stool.  No rectal bleeding. Was in the low 130s in March, now 125. States she had dealt with decreased appetite but now it is improving. Present for a few months. No dysphagia.  Complains of right ankle pain. Occasional heart palpitations.    CT July 2015:  IMPRESSION:  2.9 cm nonenhancing fluid density lesion in the pancreatic head,  increased since 2012, with adjacent irregularly dilated pancreatic  duct.  Overall appearance favors a pseudocyst related to prior/chronic  pancreatitis, less likely oligocystic serous cystadenoma or side  branch IPMN.  Given interval growth, consider EUS with cyst aspiration for further  characterization.  Additional ancillary findings as above.  Past Medical History  Diagnosis Date  . Allergic rhinitis   . Depression   . Hyperlipidemia   . Uncontrolled stage 2 hypertension   . Numbness of foot     Bilateral   . Cholecystitis chronic   . Right forearm fracture   . Dysuria   . Anemia, iron deficiency   . Hypothyroidism   . Renal mass   . NASH (nonalcoholic steatohepatitis)   . COPD (chronic obstructive pulmonary disease)   . Thyroid cancer     Past Surgical History  Procedure Laterality Date  . Vesicovaginal fistula closure w/ tah    . Thyroidectomy    . Back surgery      x2  . Right cataract extraction    . Left cataract extraction    . Eye surgery Left      [facility] and [place] surgery Right   . Spine surgery      two times    Current Outpatient Prescriptions  Medication Sig Dispense Refill  . acetaminophen (TYLENOL) 325 MG tablet One tablet up to 3 times per week , for back pain or headache  40 tablet  1  . alendronate (FOSAMAX) 70 MG tablet Take 70 mg by mouth once a week. Take with a full glass of water on an empty stomach.      Marland Kitchen atorvastatin (LIPITOR) 10 MG tablet Take 1 tablet (10 mg total) by mouth daily.  90 tablet  1  . enalapril (VASOTEC) 20 MG tablet 2 tabs daily  180 tablet  1  . ezetimibe (ZETIA) 10 MG tablet Take 1 tablet (10 mg total) by mouth daily.  90 tablet  1  . Ferrous Fumarate 324 MG TABS Take 1 tablet (106 mg of iron total) by mouth 2 (two) times daily.  60 each  3  . hydrochlorothiazide (HYDRODIURIL) 25 MG tablet TAKE 1 TABLET BY MOUTH EVERY DAY  90 tablet  1  . levothyroxine (SYNTHROID, LEVOTHROID) 50 MCG tablet Take 1 tablet (50 mcg total) by mouth daily. Take 1/2 mon wed and fri and 1 tab all other days  90 tablet  1  . NIFEdipine (PROCARDIA-XL/ADALAT CC) 30 MG 24 hr tablet TAKE 1 TABLET BY MOUTH EVERY DAY  90 tablet  1  . indomethacin (INDOCIN) 25 MG capsule One capsule three times daily for 2 days , then one twice daily for 2 days , then one daily for 3 days  13 capsule  0  . polyethylene glycol-electrolytes (TRILYTE) 420 G solution Take 4,000 mLs by mouth as directed.  4000 mL  0  . predniSONE (DELTASONE) 5 MG tablet Take 1 tablet (5 mg total) by mouth 2 (two) times daily.  10 tablet  0   No current facility-administered medications for this visit.    Allergies as of 05/31/2014  . (No Known Allergies)    Family History  Problem Relation Age of Onset  . Cancer Mother   . Hypertension Mother   . Cancer Father   . Hypertension Father   . Colon cancer Neg Hx     History   Social History  . Marital Status: Divorced    Spouse Name: N/A    Number of Children: 2  . Years of Education: N/A    Occupational History  . [employer]    .     Social History Main Topics  . Smoking status: Former Smoker    Types: Cigarettes  . Smokeless tobacco: Not on file  . Alcohol Use: No  . Drug Use: No  . Sexual Activity: Not Currently   Other Topics Concern  . Not on file   Social History Narrative  . No narrative on file    Review of Systems: Negative unless mentioned in HPI.   Physical Exam: BP 156/76  Pulse 52  Temp(Src) 97.5 F (36.4 C) (Oral)  Resp 18  Ht 5\' 7"  (1.702 m)  Wt 125 lb (56.7 kg)  BMI 19.57 kg/m2 General:   Alert and oriented. Pleasant and cooperative. Well-nourished and well-developed.  Head:  Normocephalic and atraumatic. Eyes:  Without icterus, sclera clear and conjunctiva pink.  Ears:  Normal auditory acuity. Nose:  No deformity, discharge,  or lesions. Mouth:  No deformity or lesions, oral mucosa pink.  Neck:  Supple, without mass or thyromegaly. Lungs:  Clear to auscultation bilaterally. No wheezes, rales, or rhonchi. No distress.  Heart:  S1, S2 present without murmurs appreciated.  Abdomen:  +BS, soft, non-tender and non-distended. No HSM noted. No guarding or rebound. No masses appreciated.  Rectal:  Deferred  Extremities:  Right ankle with very slight edema compared to left. No calf edema, no tenderness to palpation. Neurologic:  Alert and  oriented x4;  grossly normal neurologically. Skin:  Intact without significant lesions or rashes. Cervical Nodes:  No significant cervical adenopathy. Psych:  Alert and cooperative. Normal mood and affect.

## 2014-05-31 NOTE — Progress Notes (Signed)
Referral has been sent to Dr. Ardis Hughs

## 2014-05-31 NOTE — Telephone Encounter (Signed)
Message copied by Barron Alvine on Wed May 31, 2014  1:28 PM ------      Message from: Owens Loffler P      Created: Wed May 31, 2014  1:06 PM      Regarding: RE: EGD/EUS WITH BIOPSY       I reviewed CT; 2.9cm cystic lesion, abutting dilated and somewhat ectatic main pancreatic duct.            Please set up EUS, radial +/- linear, 60 min, ++MAC, next available EUS Thursday.            Thanks            ----- Message -----         From: Barron Alvine, CMA         Sent: 05/31/2014   1:02 PM           To: Milus Banister, MD      Subject: Melton Alar: EGD/EUS WITH BIOPSY                                              ----- Message -----         From: Michelene Gardener Lovelace         Sent: 05/31/2014  12:35 PM           To: Barron Alvine, CMA      Subject: EGD/EUS WITH BIOPSY                                      We have referred you for a procedure in [place] called an endoscopic ultrasound with Dr. Ardis Hughs. We need to have a better idea of what is going on in your pancreas. They will take a biopsy at that time       ------

## 2014-05-31 NOTE — Telephone Encounter (Signed)
Scripts for indomethacin and prednisone printed for presumed gout flare

## 2014-06-01 ENCOUNTER — Telehealth: Payer: Self-pay | Admitting: Family Medicine

## 2014-06-01 ENCOUNTER — Other Ambulatory Visit: Payer: Self-pay

## 2014-06-01 DIAGNOSIS — K862 Cyst of pancreas: Secondary | ICD-10-CM

## 2014-06-01 NOTE — Telephone Encounter (Signed)
I explained to patient that she could not take that much of those meds and a short course of prednisone and indocin is what is recommended. Told her to call back if ankle doesn't get better and we would take further action.

## 2014-06-01 NOTE — Telephone Encounter (Signed)
EUS scheduled, pt instructed and medications reviewed.  Patient instructions mailed to home.  Patient to call with any questions or concerns.  

## 2014-06-02 ENCOUNTER — Telehealth: Payer: Self-pay | Admitting: Family Medicine

## 2014-06-04 NOTE — Assessment & Plan Note (Signed)
No prior lower GI evaluation. Unintentional weight loss noted, likely multifactorial. No rectal bleeding or family history of colon cancer. Recently noted intermittent tenesmus. Needs lower GI evaluation after EUS completed with Dr. Ardis Hughs. Will arrange for colonoscopy with Dr. Gala Romney in August. Risks and benefits discussed in detail with stated understanding.

## 2014-06-04 NOTE — Assessment & Plan Note (Signed)
80 year old female with pancreatic lesion revealing interval growth, differentials as above. Needs EUS with biopsy for further diagnostic evaluation. Will refer to Dr. Ardis Hughs for EUS with FNA biopsy. Risks and benefits discussed with patient at time of appointment. Weight loss likely multifactorial. Will arrange outpatient colonoscopy at later date, with priority for EUS first.

## 2014-06-05 ENCOUNTER — Encounter (HOSPITAL_COMMUNITY): Payer: Self-pay | Admitting: Pharmacy Technician

## 2014-06-06 ENCOUNTER — Encounter: Payer: Self-pay | Admitting: *Deleted

## 2014-06-06 ENCOUNTER — Encounter (HOSPITAL_COMMUNITY): Payer: Self-pay | Admitting: *Deleted

## 2014-06-06 NOTE — Telephone Encounter (Signed)
Patient understands.

## 2014-06-07 ENCOUNTER — Encounter (HOSPITAL_COMMUNITY): Payer: Self-pay | Admitting: Pharmacy Technician

## 2014-06-07 ENCOUNTER — Encounter: Payer: Self-pay | Admitting: Gastroenterology

## 2014-06-13 NOTE — Progress Notes (Signed)
cc'd to pcp 

## 2014-06-15 ENCOUNTER — Ambulatory Visit (HOSPITAL_COMMUNITY)
Admission: RE | Admit: 2014-06-15 | Discharge: 2014-06-15 | Disposition: A | Discharge status: Home / Self Care | Payer: Medicare Other | Source: Ambulatory Visit | Attending: Gastroenterology | Admitting: Gastroenterology

## 2014-06-15 ENCOUNTER — Encounter (HOSPITAL_COMMUNITY)
Admission: RE | Disposition: A | Discharge status: Home / Self Care | Payer: Self-pay | Source: Ambulatory Visit | Attending: Gastroenterology

## 2014-06-15 ENCOUNTER — Ambulatory Visit (HOSPITAL_COMMUNITY): Payer: Medicare Other | Admitting: Anesthesiology

## 2014-06-15 ENCOUNTER — Encounter (HOSPITAL_COMMUNITY): Payer: Medicare Other | Admitting: Anesthesiology

## 2014-06-15 ENCOUNTER — Encounter (HOSPITAL_COMMUNITY): Payer: Self-pay | Admitting: Gastroenterology

## 2014-06-15 DIAGNOSIS — J449 Chronic obstructive pulmonary disease, unspecified: Secondary | ICD-10-CM | POA: Insufficient documentation

## 2014-06-15 DIAGNOSIS — F329 Major depressive disorder, single episode, unspecified: Secondary | ICD-10-CM | POA: Insufficient documentation

## 2014-06-15 DIAGNOSIS — K863 Pseudocyst of pancreas: Principal | ICD-10-CM

## 2014-06-15 DIAGNOSIS — D509 Iron deficiency anemia, unspecified: Secondary | ICD-10-CM | POA: Insufficient documentation

## 2014-06-15 DIAGNOSIS — E890 Postprocedural hypothyroidism: Secondary | ICD-10-CM | POA: Diagnosis not present

## 2014-06-15 DIAGNOSIS — Z7983 Long term (current) use of bisphosphonates: Secondary | ICD-10-CM | POA: Diagnosis not present

## 2014-06-15 DIAGNOSIS — E785 Hyperlipidemia, unspecified: Secondary | ICD-10-CM | POA: Insufficient documentation

## 2014-06-15 DIAGNOSIS — Z85850 Personal history of malignant neoplasm of thyroid: Secondary | ICD-10-CM | POA: Diagnosis not present

## 2014-06-15 DIAGNOSIS — K862 Cyst of pancreas: Secondary | ICD-10-CM | POA: Insufficient documentation

## 2014-06-15 DIAGNOSIS — Z79899 Other long term (current) drug therapy: Secondary | ICD-10-CM | POA: Insufficient documentation

## 2014-06-15 DIAGNOSIS — F3289 Other specified depressive episodes: Secondary | ICD-10-CM | POA: Insufficient documentation

## 2014-06-15 DIAGNOSIS — E039 Hypothyroidism, unspecified: Secondary | ICD-10-CM | POA: Insufficient documentation

## 2014-06-15 DIAGNOSIS — Z87891 Personal history of nicotine dependence: Secondary | ICD-10-CM | POA: Diagnosis not present

## 2014-06-15 DIAGNOSIS — J4489 Other specified chronic obstructive pulmonary disease: Secondary | ICD-10-CM | POA: Insufficient documentation

## 2014-06-15 DIAGNOSIS — K219 Gastro-esophageal reflux disease without esophagitis: Secondary | ICD-10-CM | POA: Diagnosis not present

## 2014-06-15 DIAGNOSIS — I10 Essential (primary) hypertension: Secondary | ICD-10-CM | POA: Insufficient documentation

## 2014-06-15 HISTORY — PX: EUS: SHX5427

## 2014-06-15 SURGERY — UPPER ENDOSCOPIC ULTRASOUND (EUS) LINEAR
Anesthesia: Monitor Anesthesia Care

## 2014-06-15 MED ORDER — PROPOFOL 10 MG/ML IV BOLUS
INTRAVENOUS | Status: AC
  Filled 2014-06-15: qty 20

## 2014-06-15 MED ORDER — CIPROFLOXACIN HCL 500 MG PO TABS: 500.0000 mg | ORAL_TABLET | Freq: Two times a day (BID) | ORAL | Status: DC

## 2014-06-15 MED ORDER — PROPOFOL INFUSION 10 MG/ML OPTIME
INTRAVENOUS | Status: DC | PRN
  Administered 2014-06-15: 80 ug/kg/min via INTRAVENOUS

## 2014-06-15 MED ORDER — GLYCOPYRROLATE 0.2 MG/ML IJ SOLN
INTRAMUSCULAR | Status: DC | PRN
  Administered 2014-06-15: 0.2 mg via INTRAVENOUS

## 2014-06-15 MED ORDER — SODIUM CHLORIDE 0.9 % IV SOLN: INTRAVENOUS | Status: DC

## 2014-06-15 MED ORDER — LACTATED RINGERS IV SOLN
INTRAVENOUS | Status: DC
  Administered 2014-06-15: 1000 mL via INTRAVENOUS

## 2014-06-15 NOTE — Discharge Instructions (Signed)

## 2014-06-15 NOTE — Op Note (Signed)
[facility] [place], [address]   ENDOSCOPIC ULTRASOUND PROCEDURE REPORT  PATIENT: Tina Goodwin, Tina Goodwin  MR#: [id number] BIRTHDATE: 1932/02/14  GENDER: Female ENDOSCOPIST: Milus Banister, MD REFERRED BY:  Garfield Cornea, M.D. PROCEDURE DATE:  06/15/2014 PROCEDURE:   Upper EUS w/FNA ASA CLASS:      Class III INDICATIONS:   1.  incidental lesion in pancreas; cystic on CT, abnormal associated pancreatic duct. MEDICATIONS: MAC sedation, administered by CRNA and Cipro 400 mg IV   DESCRIPTION OF PROCEDURE:   After the risks benefits and alternatives of the procedure were  explained, informed consent was obtained. The patient was then placed in the left, lateral, decubitus postion and IV sedation was administered. Throughout the procedure, the patients blood pressure, pulse and oxygen saturations were monitored continuously.  Under direct visualization, the EUS scope [id number]  endoscope was introduced through the mouth  and advanced to the second portion of the duodenum .  Water was used as necessary to provide an acoustic interface.  Upon completion of the imaging, water was removed and the patient was sent to the recovery room in satisfactory condition.  Images were taken but not saved due to computer issue  Endoscopic findings: 1. Normal UGI tract  EUS findings: 1. Hypoechoic (solid appearing) mass in pancreatic head. This was well circumscribed, measured 2.3cm across. The mass was sampled with three transduodenal passes of a 25 gauge EUS FNA needle. The mass does not abut SMA, SMV, Celiac trunk, PV. 2. Main pancreatic duct is dilated and ectatic proximal to the mass (upstream dilation), measured 58mm in body. 3. No peripancreatic adenopathy. 4. CBD was normal, non-dilated and contained no stones 5. Limited views of liver, spleen, portal and splenic vessels were all normal.  Impression: 2.3cm lesion in head of pancreas with associated main  pancreatic duct dilation.  This appeared cystic on CT however appears more solid on this examination. The aspirated sample with tan, browning (suspicious for hemorrhagic cyst).  There was not enough aspirated sample to send for CEA or amylase despite suction.  Await final cytology results.   _______________________________ eSignedMilus Banister, MD 06/15/2014 1:56 PM

## 2014-06-15 NOTE — Anesthesia Preprocedure Evaluation (Signed)
Anesthesia Evaluation  Patient identified by MRN, date of birth, ID band Patient awake    Reviewed: Allergy & Precautions, H&P , NPO status , Patient's Chart, lab work & pertinent test results  Airway Mallampati: II TM Distance: >3 FB Neck ROM: Full    Dental  (+) Poor Dentition, Upper Dentures, Loose,    Pulmonary neg pulmonary ROS, shortness of breath, COPDformer smoker,  breath sounds clear to auscultation  Pulmonary exam normal       Cardiovascular hypertension, Pt. on medications negative cardio ROS  Rhythm:Regular Rate:Normal     Neuro/Psych Depression negative neurological ROS  negative psych ROS   GI/Hepatic negative GI ROS, GERD-  Medicated,(+) Hepatitis - (Nonalcoholic steatohepatitis.)  Endo/Other  Hypothyroidism   Renal/GU negative Renal ROS  negative genitourinary   Musculoskeletal negative musculoskeletal ROS (+)   Abdominal   Peds  Hematology negative hematology ROS (+) anemia ,   Anesthesia Other Findings Risk of dental injury explained and patient made aware of possible loss of teeth.  Reproductive/Obstetrics                           Anesthesia Physical Anesthesia Plan  ASA: III  Anesthesia Plan: MAC   Post-op Pain Management:    Induction: Intravenous  Airway Management Planned: Nasal Cannula  Additional Equipment:   Intra-op Plan:   Post-operative Plan:   Informed Consent: I have reviewed the patients History and Physical, chart, labs and discussed the procedure including the risks, benefits and alternatives for the proposed anesthesia with the patient or authorized representative who has indicated his/her understanding and acceptance.   Dental advisory given  Plan Discussed with: CRNA  Anesthesia Plan Comments:         Anesthesia Quick Evaluation

## 2014-06-15 NOTE — Interval H&P Note (Signed)
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 After consideration of risks, benefits and other options for treatment, the patient has consented to  Procedure(s): UPPER ENDOSCOPIC ULTRASOUND (EUS) LINEAR (N/A) as a surgical intervention .  The patient's history has been reviewed, patient examined, no change in status, stable for surgery.  I have reviewed the patient's chart and labs.  Questions were answered to the patient's satisfaction.     Milus Banister

## 2014-06-15 NOTE — H&P (View-Only) (Signed)
.    Primary Care Physician:  Tula Nakayama, MD Primary Gastroenterologist:  Dr. Gala Romney   Chief Complaint  Patient presents with  . Referral    HPI:   Tina Goodwin is a pleasant 80 year old female who presents today at the request of Dr. Moshe Cipro secondary to a pancreatic lesion in the pancreatic head, increased since 2012. Question of pseudocyst related to prior/chronic pancreatitis, less likely cystadenoma or side branch IPMN. She has had no prior EGD or colonoscopy. Notes intermittent tenesmus. BM usually every day after cup of coffee but takes awhile to come out. Soft stool.  No rectal bleeding. Was in the low 130s in March, now 125. States she had dealt with decreased appetite but now it is improving. Present for a few months. No dysphagia.  Complains of right ankle pain. Occasional heart palpitations.    CT July 2015:  IMPRESSION:  2.9 cm nonenhancing fluid density lesion in the pancreatic head,  increased since 2012, with adjacent irregularly dilated pancreatic  duct.  Overall appearance favors a pseudocyst related to prior/chronic  pancreatitis, less likely oligocystic serous cystadenoma or side  branch IPMN.  Given interval growth, consider EUS with cyst aspiration for further  characterization.  Additional ancillary findings as above.  Past Medical History  Diagnosis Date  . Allergic rhinitis   . Depression   . Hyperlipidemia   . Uncontrolled stage 2 hypertension   . Numbness of foot     Bilateral   . Cholecystitis chronic   . Right forearm fracture   . Dysuria   . Anemia, iron deficiency   . Hypothyroidism   . Renal mass   . NASH (nonalcoholic steatohepatitis)   . COPD (chronic obstructive pulmonary disease)   . Thyroid cancer     Past Surgical History  Procedure Laterality Date  . Vesicovaginal fistula closure w/ tah    . Thyroidectomy    . Back surgery      x2  . Right cataract extraction    . Left cataract extraction    . Eye surgery Left      [facility] Right   . Spine surgery      two times    Current Outpatient Prescriptions  Medication Sig Dispense Refill  . acetaminophen (TYLENOL) 325 MG tablet One tablet up to 3 times per week , for back pain or headache  40 tablet  1  . alendronate (FOSAMAX) 70 MG tablet Take 70 mg by mouth once a week. Take with a full glass of water on an empty stomach.      Marland Kitchen atorvastatin (LIPITOR) 10 MG tablet Take 1 tablet (10 mg total) by mouth daily.  90 tablet  1  . enalapril (VASOTEC) 20 MG tablet 2 tabs daily  180 tablet  1  . ezetimibe (ZETIA) 10 MG tablet Take 1 tablet (10 mg total) by mouth daily.  90 tablet  1  . Ferrous Fumarate 324 MG TABS Take 1 tablet (106 mg of iron total) by mouth 2 (two) times daily.  60 each  3  . hydrochlorothiazide (HYDRODIURIL) 25 MG tablet TAKE 1 TABLET BY MOUTH EVERY DAY  90 tablet  1  . levothyroxine (SYNTHROID, LEVOTHROID) 50 MCG tablet Take 1 tablet (50 mcg total) by mouth daily. Take 1/2 mon wed and fri and 1 tab all other days  90 tablet  1  . NIFEdipine (PROCARDIA-XL/ADALAT CC) 30 MG 24 hr tablet TAKE 1 TABLET BY MOUTH EVERY DAY  90 tablet  1  . indomethacin (INDOCIN) 25 MG capsule One capsule three times daily for 2 days , then one twice daily for 2 days , then one daily for 3 days  13 capsule  0  . polyethylene glycol-electrolytes (TRILYTE) 420 G solution Take 4,000 mLs by mouth as directed.  4000 mL  0  . predniSONE (DELTASONE) 5 MG tablet Take 1 tablet (5 mg total) by mouth 2 (two) times daily.  10 tablet  0   No current facility-administered medications for this visit.    Allergies as of 05/31/2014  . (No Known Allergies)    Family History  Problem Relation Age of Onset  . Cancer Mother   . Hypertension Mother   . Cancer Father   . Hypertension Father   . Colon cancer Neg Hx     History   Social History  . Marital Status: Divorced    Spouse Name: N/A    Number of Children: 2  . Years of Education: N/A    Occupational History  . Plastic plant    .     Social History Main Topics  . Smoking status: Former Smoker    Types: Cigarettes  . Smokeless tobacco: Not on file  . Alcohol Use: No  . Drug Use: No  . Sexual Activity: Not Currently   Other Topics Concern  . Not on file   Social History Narrative  . No narrative on file    Review of Systems: Negative unless mentioned in HPI.   Physical Exam: BP 156/76  Pulse 52  Temp(Src) 97.5 F (36.4 C) (Oral)  Resp 18  Ht 5\' 7"  (1.702 m)  Wt 125 lb (56.7 kg)  BMI 19.57 kg/m2 General:   Alert and oriented. Pleasant and cooperative. Well-nourished and well-developed.  Head:  Normocephalic and atraumatic. Eyes:  Without icterus, sclera clear and conjunctiva pink.  Ears:  Normal auditory acuity. Nose:  No deformity, discharge,  or lesions. Mouth:  No deformity or lesions, oral mucosa pink.  Neck:  Supple, without mass or thyromegaly. Lungs:  Clear to auscultation bilaterally. No wheezes, rales, or rhonchi. No distress.  Heart:  S1, S2 present without murmurs appreciated.  Abdomen:  +BS, soft, non-tender and non-distended. No HSM noted. No guarding or rebound. No masses appreciated.  Rectal:  Deferred  Extremities:  Right ankle with very slight edema compared to left. No calf edema, no tenderness to palpation. Neurologic:  Alert and  oriented x4;  grossly normal neurologically. Skin:  Intact without significant lesions or rashes. Cervical Nodes:  No significant cervical adenopathy. Psych:  Alert and cooperative. Normal mood and affect.

## 2014-06-15 NOTE — Anesthesia Postprocedure Evaluation (Signed)
Anesthesia Post Note  Patient: Tina Goodwin  Procedure(s) Performed: Procedure(s) (LRB): UPPER ENDOSCOPIC ULTRASOUND (EUS) LINEAR (N/A)  Anesthesia type: MAC  Patient location: PACU  Post pain: Pain level controlled  Post assessment: Post-op Vital signs reviewed  Last Vitals:  Filed Vitals:   06/15/14 1410  BP: 167/66  Pulse: 71  Temp:   Resp: 16    Post vital signs: Reviewed  Level of consciousness: sedated  Complications: No apparent anesthesia complications

## 2014-06-15 NOTE — Transfer of Care (Signed)
Immediate Anesthesia Transfer of Care Note  Patient: Tina Goodwin  Procedure(s) Performed: Procedure(s): UPPER ENDOSCOPIC ULTRASOUND (EUS) LINEAR (N/A)  Patient Location: PACU  Anesthesia Type:MAC  Level of Consciousness: sedated  Airway & Oxygen Therapy: Patient Spontanous Breathing and Patient connected to nasal cannula oxygen  Post-op Assessment: Report given to PACU RN and Post -op Vital signs reviewed and stable  Post vital signs: Reviewed and stable  Complications: No apparent anesthesia complications

## 2014-06-16 ENCOUNTER — Telehealth: Payer: Self-pay | Admitting: Family Medicine

## 2014-06-19 ENCOUNTER — Encounter (HOSPITAL_COMMUNITY): Payer: Self-pay | Admitting: Gastroenterology

## 2014-06-19 MED ORDER — HYDROCHLOROTHIAZIDE 25 MG PO TABS: 25.0000 mg | ORAL_TABLET | Freq: Every morning | ORAL | Status: DC

## 2014-06-19 MED ORDER — OMEPRAZOLE 40 MG PO CPDR: 40.0000 mg | DELAYED_RELEASE_CAPSULE | Freq: Every day | ORAL | Status: DC

## 2014-06-19 MED ORDER — ENALAPRIL MALEATE 20 MG PO TABS: ORAL_TABLET | ORAL | Status: DC

## 2014-06-19 NOTE — Telephone Encounter (Signed)
meds sent

## 2014-06-20 ENCOUNTER — Telehealth: Payer: Self-pay | Admitting: Gastroenterology

## 2014-06-20 NOTE — Telephone Encounter (Signed)
Patient cancelled TCS w/RMR on Wednesday  06/21/14 she will need a new H&P before she can be R/S

## 2014-06-21 ENCOUNTER — Encounter (HOSPITAL_COMMUNITY): Admission: RE | Payer: Self-pay | Source: Ambulatory Visit

## 2014-06-21 ENCOUNTER — Ambulatory Visit (HOSPITAL_COMMUNITY): Admission: RE | Admit: 2014-06-21 | Payer: Medicare Other | Source: Ambulatory Visit | Admitting: Internal Medicine

## 2014-06-21 SURGERY — COLONOSCOPY
Anesthesia: Moderate Sedation

## 2014-07-07 ENCOUNTER — Telehealth: Payer: Self-pay

## 2014-07-07 ENCOUNTER — Other Ambulatory Visit: Payer: Self-pay

## 2014-07-07 MED ORDER — PREDNISONE 5 MG PO TABS: 5.0000 mg | ORAL_TABLET | Freq: Two times a day (BID) | ORAL | Status: DC

## 2014-07-07 MED ORDER — TRAMADOL HCL 50 MG PO TABS: 50.0000 mg | ORAL_TABLET | Freq: Two times a day (BID) | ORAL | Status: DC | PRN

## 2014-07-07 NOTE — Telephone Encounter (Signed)
States she needs something for her back pain. Hurts constantly all the time and especially if she tries to work or clean, etc. Rates pain around an 8. Needs something to take for it, can't go all weekend without she states. Please advise

## 2014-07-10 NOTE — Telephone Encounter (Signed)
Per Dr, med sent to pharmacy and pt aware

## 2014-07-12 ENCOUNTER — Telehealth: Payer: Self-pay | Admitting: Obstetrics and Gynecology

## 2014-07-12 ENCOUNTER — Telehealth: Payer: Self-pay

## 2014-07-12 DIAGNOSIS — R87629 Unspecified abnormal cytological findings in specimens from vagina: Secondary | ICD-10-CM

## 2014-07-12 NOTE — Telephone Encounter (Signed)
I received a call from Dr Moshe Cipro re: Ms Dolinski, referring pt for evaluation of pt c/o of - "stool problem"- has seen Dr Gala Romney recently with - workup - history of Vesico-Vag fistula addressed at [facility] -s/p hysterectomy with removal of ovaries - recent pap with "ASCUS WITH + HPV" - workup for ?Ca Pancreas in 2014 negative CT  Plan: will arrange appt and consult, will likely need colpo, and exam. jvf

## 2014-07-12 NOTE — Telephone Encounter (Signed)
Patient agrees to referral

## 2014-07-18 NOTE — Telephone Encounter (Signed)
This is correct  Documentation , thank you

## 2014-07-31 ENCOUNTER — Ambulatory Visit (INDEPENDENT_AMBULATORY_CARE_PROVIDER_SITE_OTHER): Payer: Medicare Other | Admitting: Obstetrics and Gynecology

## 2014-07-31 ENCOUNTER — Encounter: Payer: Self-pay | Admitting: Obstetrics and Gynecology

## 2014-07-31 VITALS — BP 122/66 | Ht 66.0 in | Wt 121.0 lb

## 2014-07-31 DIAGNOSIS — K5909 Other constipation: Secondary | ICD-10-CM

## 2014-07-31 DIAGNOSIS — Z01419 Encounter for gynecological examination (general) (routine) without abnormal findings: Secondary | ICD-10-CM

## 2014-07-31 NOTE — Progress Notes (Signed)
Patient ID: Tina Goodwin, female   DOB: 1932/05/17, 80 y.o.   MRN: [id number]   [facility] Visit  Patient name: Tina Goodwin MRN [id number]  Date of birth: [birth year]  CC & HPI:  Tina Goodwin is a 80 y.o. female presenting today for constant constipation that started 3 months ago.  Pt states that she has to strain to void at times.  She will experience times where she will not be able to pass a stool.  She states that her symptoms make it difficult to urinate.  She is not taking any stool softeners.  She has incorporated more fiber in her diet to try and alleviate her symptoms.  She had two children delivered vaginally.  She has a history of abdominal hysterectomy due to a tumor.   ROS:  All systems are reviewed and otherwise negative unless otherwise indicated in the HPI.  Pertinent History Reviewed:   Reviewed: Significant for  Medical         Past Medical History  Diagnosis Date  . Allergic rhinitis   . Depression   . Hyperlipidemia   . Uncontrolled stage 2 hypertension   . Numbness of foot     Bilateral   . Cholecystitis chronic   . Right forearm fracture   . Dysuria   . Anemia, iron deficiency   . Hypothyroidism   . Renal mass   . NASH (nonalcoholic steatohepatitis)   . COPD (chronic obstructive pulmonary disease)   . Thyroid cancer                               Surgical Hx:    Past Surgical History  Procedure Laterality Date  . Vesicovaginal fistula closure w/ tah    . Thyroidectomy    . Back surgery      x2  . Right cataract extraction    . Left cataract extraction    . Eye surgery Left     [facility] surgery Right   . Spine surgery      two times  . Eus N/A 06/15/2014    Procedure: UPPER ENDOSCOPIC ULTRASOUND (EUS) LINEAR;  Surgeon: Milus Banister, MD;  Location: [facility];  Service: Endoscopy;  Laterality: N/A;   Medications: Reviewed & Updated - see associated section                      Current outpatient  prescriptions:acetaminophen (TYLENOL) 500 MG tablet, Take 500 mg by mouth every 6 (six) hours as needed for mild pain., Disp: , Rfl: ;  atorvastatin (LIPITOR) 10 MG tablet, Take 10 mg by mouth daily., Disp: , Rfl: ;  enalapril (VASOTEC) 20 MG tablet, 2 tabs daily, Disp: 180 tablet, Rfl: 1;  ezetimibe (ZETIA) 10 MG tablet, Take 10 mg by mouth at bedtime., Disp: , Rfl:  hydrochlorothiazide (HYDRODIURIL) 25 MG tablet, Take 1 tablet (25 mg total) by mouth every morning., Disp: 90 tablet, Rfl: 1;  levothyroxine (SYNTHROID, LEVOTHROID) 50 MCG tablet, Take 50 mcg by mouth daily before breakfast., Disp: , Rfl: ;  Multiple Vitamin (MULTIVITAMIN WITH MINERALS) TABS tablet, Take 1 tablet by mouth daily., Disp: , Rfl: ;  NIFEdipine (PROCARDIA-XL/ADALAT CC) 30 MG 24 hr tablet, Take 30 mg by mouth every morning., Disp: , Rfl:  omeprazole (PRILOSEC) 40 MG capsule, Take 1 capsule (40 mg total) by mouth daily., Disp: 90 capsule, Rfl: 1;  traMADol (ULTRAM) 50 MG tablet, Take 1 tablet (50 mg total) by mouth 2 (two) times daily as needed., Disp: 20 tablet, Rfl: 0   Social History: Reviewed -  reports that she quit smoking about 13 years ago. Her smoking use included Cigarettes. She smoked 0.00 packs per day. She has never used smokeless tobacco.  Objective Findings:  Vitals: Blood pressure 122/66, height 5\' 6"  (1.676 m), weight 121 lb (54.885 kg).  Physical Examination: General appearance - alert, well appearing, and in no distress, oriented to person, place, and time and normal appearing weight Mental status - alert, oriented to person, place, and time, normal mood, behavior, speech, dress, motor activity, and thought processes Pelvic - normal external genitalia, vulva, vagina, cervix, uterus and adnexa,  VULVA: normal appearing vulva with no masses, tenderness or lesions,  VAGINA: normal appearing vagina with normal color and discharge, no lesions, vaginal cuff is well supported, anterior wall has good support, normal  back wall, CERVIX: surgically absent,  UTERUS: surgically absent ADNEXA: surgically absent Rectal - normal rectal, no masses, external hemorrhoid only,    Assessment & Plan:   A: External hemorrhoid 1. Progressive Constipation  P:  1. Will start patient on stool softeners.      This chart was scribed by Donato Schultz, Medical Scribe, for Dr. Mallory Shirk on 07/31/2014 at 10:44 AM. This chart was reviewed by Dr. Mallory Shirk for accuracy.

## 2014-07-31 NOTE — Patient Instructions (Signed)

## 2014-08-09 ENCOUNTER — Encounter: Payer: Self-pay | Admitting: *Deleted

## 2014-08-17 ENCOUNTER — Other Ambulatory Visit: Payer: Self-pay

## 2014-08-17 MED ORDER — TRAMADOL HCL 50 MG PO TABS: 50.0000 mg | ORAL_TABLET | Freq: Two times a day (BID) | ORAL | Status: DC | PRN

## 2014-08-29 ENCOUNTER — Other Ambulatory Visit: Payer: Self-pay

## 2014-08-29 MED ORDER — EZETIMIBE 10 MG PO TABS: 10.0000 mg | ORAL_TABLET | Freq: Every day | ORAL | Status: DC

## 2014-08-29 MED ORDER — ATORVASTATIN CALCIUM 10 MG PO TABS: 10.0000 mg | ORAL_TABLET | Freq: Every day | ORAL | Status: DC

## 2014-08-29 MED ORDER — NIFEDIPINE ER 30 MG PO TB24: 30.0000 mg | ORAL_TABLET | Freq: Every morning | ORAL | Status: DC

## 2014-08-30 ENCOUNTER — Ambulatory Visit: Payer: Medicare Other | Admitting: Family Medicine

## 2014-09-19 ENCOUNTER — Telehealth: Payer: Self-pay | Admitting: Family Medicine

## 2014-09-19 MED ORDER — ENALAPRIL MALEATE 20 MG PO TABS: ORAL_TABLET | ORAL | Status: DC

## 2014-09-19 MED ORDER — HYDROCHLOROTHIAZIDE 25 MG PO TABS: 25.0000 mg | ORAL_TABLET | Freq: Every morning | ORAL | Status: DC

## 2014-09-19 MED ORDER — OMEPRAZOLE 40 MG PO CPDR
40.0000 mg | DELAYED_RELEASE_CAPSULE | Freq: Every day | ORAL | Status: DC
Start: 2014-09-19 — End: 2014-12-18

## 2014-09-19 NOTE — Telephone Encounter (Signed)
meds refilled 

## 2014-09-19 NOTE — Telephone Encounter (Signed)
Needs her omeprazole  90 pills  Hydrochlorothiazide 90 pills and enalapril 180 pills send to [facility]

## 2014-09-26 ENCOUNTER — Encounter (INDEPENDENT_AMBULATORY_CARE_PROVIDER_SITE_OTHER): Payer: Self-pay

## 2014-09-26 ENCOUNTER — Ambulatory Visit (INDEPENDENT_AMBULATORY_CARE_PROVIDER_SITE_OTHER): Payer: Medicare Other | Admitting: Family Medicine

## 2014-09-26 VITALS — BP 126/68 | HR 68 | Resp 18 | Ht 66.0 in | Wt 121.1 lb

## 2014-09-26 DIAGNOSIS — H5462 Unqualified visual loss, left eye, normal vision right eye: Secondary | ICD-10-CM | POA: Insufficient documentation

## 2014-09-26 DIAGNOSIS — I10 Essential (primary) hypertension: Secondary | ICD-10-CM

## 2014-09-26 DIAGNOSIS — K869 Disease of pancreas, unspecified: Secondary | ICD-10-CM

## 2014-09-26 DIAGNOSIS — M549 Dorsalgia, unspecified: Secondary | ICD-10-CM

## 2014-09-26 DIAGNOSIS — E785 Hyperlipidemia, unspecified: Secondary | ICD-10-CM

## 2014-09-26 DIAGNOSIS — M79604 Pain in right leg: Secondary | ICD-10-CM

## 2014-09-26 DIAGNOSIS — E039 Hypothyroidism, unspecified: Secondary | ICD-10-CM

## 2014-09-26 MED ORDER — METHYLPREDNISOLONE ACETATE 80 MG/ML IJ SUSP
80.0000 mg | Freq: Once | INTRAMUSCULAR | Status: AC
  Administered 2014-09-26: 80 mg via INTRAMUSCULAR

## 2014-09-26 MED ORDER — KETOROLAC TROMETHAMINE 60 MG/2ML IM SOLN
60.0000 mg | Freq: Once | INTRAMUSCULAR | Status: AC
  Administered 2014-09-26: 60 mg via INTRAMUSCULAR

## 2014-09-26 NOTE — Assessment & Plan Note (Signed)
Progressive loss of vision in past 3 month, with pain and redness at times. Has had 3 operations on left eye in the past, most recent was at [facility] available appt

## 2014-09-26 NOTE — Assessment & Plan Note (Signed)
Controlled, no change in medication  

## 2014-09-26 NOTE — Progress Notes (Signed)
   Subjective:    Patient ID: Tina Goodwin, female    DOB: 1931/12/20, 80 y.o.   MRN: [id number]  HPI The PT is here for follow up and re-evaluation of chronic medical conditions, medication management and review of any available recent lab and radiology data.  Preventive health is updated, specifically  Cancer screening and Immunization.   Questions or concerns regarding consultations or procedures which the PT has had in the interim are  addressed. The PT denies any adverse reactions to current medications since the last visit.  C/o progressive loss of vision, states "something has to be done" C/o increased and uncontrolled back pain radiaitng to right ankle, no inciting trauma       Review of Systems See HPI Denies recent fever or chills. Denies sinus pressure, nasal congestion, ear pain or sore throat. Denies chest congestion, productive cough or wheezing. Denies chest pains, palpitations and leg swelling Denies abdominal pain, nausea, vomiting,diarrhea or constipation.   Denies dysuria, frequency, hesitancy or incontinence.  Denies headaches, seizures, numbness, or tingling. Denies depression, anxiety or insomnia. Denies skin break down or rash.        Objective:   Physical Exam BP 126/68 mmHg  Pulse 68  Resp 18  Ht 5\' 6"  (1.676 m)  Wt 121 lb 1.3 oz (54.922 kg)  BMI 19.55 kg/m2  SpO2 96% Patient alert and oriented and in no cardiopulmonary distress.  HEENT: No facial asymmetry, EOMI,   oropharynx pink and moist.  Neck supple no JVD, no mass.  Chest: Clear to auscultation bilaterally.  CVS: S1, S2 no murmurs, no S3.Regular rate.  ABD: Soft non tender.   Ext: No edema  Decreased ROM lumbar  spine,  Adequate in shoulders, hips and knees.No ankle swelling on exam  Skin: Intact, no ulcerations or rash noted.  Psych: Good eye contact, normal affect. Memory intact not anxious or depressed appearing.  CNS: CN 2-12 intact, power,  normal throughout.no focal  deficits noted.        Assessment & Plan:  Vision loss of left eye Progressive loss of vision in past 3 month, with pain and redness at times. Has had 3 operations on left eye in the past, most recent was at [facility] available appt   Essential hypertension Controlled, no change in medication   Back pain with radiation Uncontrolled, toradol and depomedrol in officew , and tramadol for use at home  Hypothyroidism Controlled, no change in medication Updated lab needed at/ before next visit.   Hyperlipidemia Slightly elevated LDL Hyperlipidemia:Low fat diet discussed and encouraged.  Updated lab needed at/ before next visit.

## 2014-09-26 NOTE — Patient Instructions (Addendum)
F/u in 5.5 month, call if you need me before  Fasting lipid, cmp and tSH in 5.5 month   You are referred for eye exam to dr Gershon Crane since you cannot see well wait on appt info before you leave  Toradol and depo medrol in office for back pain  Tramadol is prescribed for uncontrolled back pain also  No swelling on right ankle now

## 2014-10-01 ENCOUNTER — Encounter: Payer: Self-pay | Admitting: Family Medicine

## 2014-10-01 DIAGNOSIS — M79604 Pain in right leg: Secondary | ICD-10-CM | POA: Insufficient documentation

## 2014-10-01 NOTE — Assessment & Plan Note (Signed)
Controlled, no change in medication Updated lab needed at/ before next visit.  

## 2014-10-01 NOTE — Assessment & Plan Note (Signed)
Right lower extremity pain from spinal stenosis , anti inflammatory  Injections in office followed by tramadol

## 2014-10-01 NOTE — Assessment & Plan Note (Signed)
Slightly elevated LDL Hyperlipidemia:Low fat diet discussed and encouraged.  Updated lab needed at/ before next visit.

## 2014-10-01 NOTE — Assessment & Plan Note (Signed)
Uncontrolled, toradol and depomedrol in officew , and tramadol for use at home

## 2014-10-11 ENCOUNTER — Encounter (INDEPENDENT_AMBULATORY_CARE_PROVIDER_SITE_OTHER): Payer: Medicare Other | Admitting: Ophthalmology

## 2014-10-11 DIAGNOSIS — H3531 Nonexudative age-related macular degeneration: Secondary | ICD-10-CM | POA: Diagnosis not present

## 2014-10-11 DIAGNOSIS — H35033 Hypertensive retinopathy, bilateral: Secondary | ICD-10-CM

## 2014-10-11 DIAGNOSIS — H3532 Exudative age-related macular degeneration: Secondary | ICD-10-CM

## 2014-10-11 DIAGNOSIS — H43813 Vitreous degeneration, bilateral: Secondary | ICD-10-CM

## 2014-10-11 DIAGNOSIS — H35372 Puckering of macula, left eye: Secondary | ICD-10-CM

## 2014-10-11 DIAGNOSIS — I10 Essential (primary) hypertension: Secondary | ICD-10-CM

## 2014-11-08 ENCOUNTER — Encounter (INDEPENDENT_AMBULATORY_CARE_PROVIDER_SITE_OTHER): Payer: Medicare Other | Admitting: Ophthalmology

## 2014-11-08 DIAGNOSIS — H3531 Nonexudative age-related macular degeneration: Secondary | ICD-10-CM

## 2014-11-08 DIAGNOSIS — H3532 Exudative age-related macular degeneration: Secondary | ICD-10-CM | POA: Diagnosis not present

## 2014-11-08 DIAGNOSIS — H35033 Hypertensive retinopathy, bilateral: Secondary | ICD-10-CM | POA: Diagnosis not present

## 2014-11-08 DIAGNOSIS — H43813 Vitreous degeneration, bilateral: Secondary | ICD-10-CM

## 2014-11-08 DIAGNOSIS — D3131 Benign neoplasm of right choroid: Secondary | ICD-10-CM

## 2014-11-08 DIAGNOSIS — I10 Essential (primary) hypertension: Secondary | ICD-10-CM | POA: Diagnosis not present

## 2014-11-15 ENCOUNTER — Encounter: Payer: Self-pay | Admitting: *Deleted

## 2014-11-23 ENCOUNTER — Telehealth: Payer: Self-pay | Admitting: Family Medicine

## 2014-11-23 MED ORDER — ATORVASTATIN CALCIUM 10 MG PO TABS: 10.0000 mg | ORAL_TABLET | Freq: Every day | ORAL | Status: DC

## 2014-11-23 MED ORDER — EZETIMIBE 10 MG PO TABS: 10.0000 mg | ORAL_TABLET | Freq: Every day | ORAL | Status: DC

## 2014-11-23 MED ORDER — NIFEDIPINE ER 30 MG PO TB24: 30.0000 mg | ORAL_TABLET | Freq: Every morning | ORAL | Status: DC

## 2014-11-23 NOTE — Telephone Encounter (Signed)
meds refilled 

## 2014-12-12 ENCOUNTER — Encounter (INDEPENDENT_AMBULATORY_CARE_PROVIDER_SITE_OTHER): Payer: Medicare Other | Admitting: Ophthalmology

## 2014-12-12 DIAGNOSIS — H3532 Exudative age-related macular degeneration: Secondary | ICD-10-CM

## 2014-12-12 DIAGNOSIS — I10 Essential (primary) hypertension: Secondary | ICD-10-CM | POA: Diagnosis not present

## 2014-12-12 DIAGNOSIS — H3531 Nonexudative age-related macular degeneration: Secondary | ICD-10-CM

## 2014-12-12 DIAGNOSIS — D3131 Benign neoplasm of right choroid: Secondary | ICD-10-CM

## 2014-12-12 DIAGNOSIS — H4313 Vitreous hemorrhage, bilateral: Secondary | ICD-10-CM

## 2014-12-12 DIAGNOSIS — H35033 Hypertensive retinopathy, bilateral: Secondary | ICD-10-CM

## 2014-12-18 ENCOUNTER — Other Ambulatory Visit: Payer: Self-pay

## 2014-12-18 MED ORDER — ENALAPRIL MALEATE 20 MG PO TABS: ORAL_TABLET | ORAL | Status: DC

## 2014-12-18 MED ORDER — OMEPRAZOLE 40 MG PO CPDR: 40.0000 mg | DELAYED_RELEASE_CAPSULE | Freq: Every day | ORAL | Status: DC

## 2014-12-18 MED ORDER — HYDROCHLOROTHIAZIDE 25 MG PO TABS: 25.0000 mg | ORAL_TABLET | Freq: Every morning | ORAL | Status: DC

## 2015-01-03 ENCOUNTER — Telehealth: Payer: Self-pay | Admitting: Family Medicine

## 2015-01-03 NOTE — Telephone Encounter (Signed)
Pt called  In stating  She  Is getting no improvement in the vision in her left eye, has been to [facility], now seeing Dr Rodena Piety, does not feel there is any improvement, wants another Doc's opinion.[place] beenwith eher to see Dr Rodena Piety also  Pls send for last office note from Dr Rodman Key, I willl review and probably discuss with another eye doc and  / Dr Rodena Piety  Before getting back to the pt. PLS give me the note when you get it  She asked about local eye Doc, but I advised that since her situation is so complicated and she has been referred out over the years I do not recommend

## 2015-01-04 ENCOUNTER — Telehealth: Payer: Self-pay | Admitting: Family Medicine

## 2015-01-04 DIAGNOSIS — H53139 Sudden visual loss, unspecified eye: Secondary | ICD-10-CM

## 2015-01-04 NOTE — Telephone Encounter (Signed)
Pls let pt know that I have seen Dr Melida Gimenez  Note. I recommend that she keep her appts with him, ask her daughter to go wwith her, so that he understands how she feels about her vision and that he can answer / address her questions. He is very specialized in his field. I am sure he is better able / more qualified than I am to choose another specialist for eyes to give her the 2nd opinion she is asking for , and he will be willing to do that I am sure , she has been treated at [facility] also for same issue

## 2015-01-04 NOTE — Telephone Encounter (Signed)
Information received from Dr. Zigmund Daniel office.

## 2015-01-09 ENCOUNTER — Encounter (INDEPENDENT_AMBULATORY_CARE_PROVIDER_SITE_OTHER): Payer: Medicare Other | Admitting: Ophthalmology

## 2015-01-09 DIAGNOSIS — D3131 Benign neoplasm of right choroid: Secondary | ICD-10-CM | POA: Diagnosis not present

## 2015-01-09 DIAGNOSIS — H43813 Vitreous degeneration, bilateral: Secondary | ICD-10-CM | POA: Diagnosis not present

## 2015-01-09 DIAGNOSIS — H3531 Nonexudative age-related macular degeneration: Secondary | ICD-10-CM | POA: Diagnosis not present

## 2015-01-09 DIAGNOSIS — I10 Essential (primary) hypertension: Secondary | ICD-10-CM

## 2015-01-09 DIAGNOSIS — H3532 Exudative age-related macular degeneration: Secondary | ICD-10-CM | POA: Diagnosis not present

## 2015-01-09 DIAGNOSIS — H35033 Hypertensive retinopathy, bilateral: Secondary | ICD-10-CM

## 2015-01-12 NOTE — Telephone Encounter (Signed)
Noted I will refer to Dr Lanell Matar

## 2015-01-12 NOTE — Telephone Encounter (Signed)
Spoke with patient and she is admit about seeing another specialist for vision loss. States that she did not like to care she received and would like a 2nd opinion.

## 2015-01-12 NOTE — Addendum Note (Signed)
Addended by: Fayrene Helper on: 01/12/2015 03:53 PM   Modules accepted: Orders

## 2015-01-15 ENCOUNTER — Encounter: Payer: Self-pay | Admitting: Family Medicine

## 2015-02-06 ENCOUNTER — Encounter (INDEPENDENT_AMBULATORY_CARE_PROVIDER_SITE_OTHER): Payer: Medicare Other | Admitting: Ophthalmology

## 2015-02-06 DIAGNOSIS — D3131 Benign neoplasm of right choroid: Secondary | ICD-10-CM | POA: Diagnosis not present

## 2015-02-06 DIAGNOSIS — H43813 Vitreous degeneration, bilateral: Secondary | ICD-10-CM | POA: Diagnosis not present

## 2015-02-06 DIAGNOSIS — H3532 Exudative age-related macular degeneration: Secondary | ICD-10-CM

## 2015-02-06 DIAGNOSIS — H3531 Nonexudative age-related macular degeneration: Secondary | ICD-10-CM | POA: Diagnosis not present

## 2015-02-06 DIAGNOSIS — H35033 Hypertensive retinopathy, bilateral: Secondary | ICD-10-CM | POA: Diagnosis not present

## 2015-02-06 DIAGNOSIS — I10 Essential (primary) hypertension: Secondary | ICD-10-CM

## 2015-02-15 ENCOUNTER — Telehealth: Payer: Self-pay | Admitting: Family Medicine

## 2015-02-15 ENCOUNTER — Other Ambulatory Visit: Payer: Self-pay

## 2015-02-15 MED ORDER — ATORVASTATIN CALCIUM 10 MG PO TABS: 10.0000 mg | ORAL_TABLET | Freq: Every day | ORAL | Status: DC

## 2015-02-15 MED ORDER — NIFEDIPINE ER 30 MG PO TB24: 30.0000 mg | ORAL_TABLET | Freq: Every morning | ORAL | Status: DC

## 2015-02-15 MED ORDER — EZETIMIBE 10 MG PO TABS: 10.0000 mg | ORAL_TABLET | Freq: Every day | ORAL | Status: DC

## 2015-02-15 NOTE — Telephone Encounter (Signed)
meds refilled 

## 2015-03-20 ENCOUNTER — Encounter (INDEPENDENT_AMBULATORY_CARE_PROVIDER_SITE_OTHER): Payer: Medicare Other | Admitting: Ophthalmology

## 2015-03-20 DIAGNOSIS — H43813 Vitreous degeneration, bilateral: Secondary | ICD-10-CM | POA: Diagnosis not present

## 2015-03-20 DIAGNOSIS — H3532 Exudative age-related macular degeneration: Secondary | ICD-10-CM | POA: Diagnosis not present

## 2015-03-20 DIAGNOSIS — I10 Essential (primary) hypertension: Secondary | ICD-10-CM

## 2015-03-20 DIAGNOSIS — H35033 Hypertensive retinopathy, bilateral: Secondary | ICD-10-CM

## 2015-03-20 DIAGNOSIS — H3531 Nonexudative age-related macular degeneration: Secondary | ICD-10-CM | POA: Diagnosis not present

## 2015-03-22 ENCOUNTER — Ambulatory Visit: Payer: Medicare Other | Admitting: Family Medicine

## 2015-03-26 ENCOUNTER — Telehealth: Payer: Self-pay

## 2015-03-27 NOTE — Telephone Encounter (Signed)
Attempted to reach patient. Unable to leave a message. 

## 2015-04-02 ENCOUNTER — Other Ambulatory Visit: Payer: Self-pay

## 2015-04-02 ENCOUNTER — Telehealth: Payer: Self-pay | Admitting: Family Medicine

## 2015-04-02 MED ORDER — ATORVASTATIN CALCIUM 10 MG PO TABS: 10.0000 mg | ORAL_TABLET | Freq: Every day | ORAL | Status: DC

## 2015-04-02 MED ORDER — EZETIMIBE 10 MG PO TABS: 10.0000 mg | ORAL_TABLET | Freq: Every day | ORAL | Status: DC

## 2015-04-02 MED ORDER — HYDROCHLOROTHIAZIDE 25 MG PO TABS: 25.0000 mg | ORAL_TABLET | Freq: Every morning | ORAL | Status: DC

## 2015-04-02 MED ORDER — LEVOTHYROXINE SODIUM 50 MCG PO TABS: 50.0000 ug | ORAL_TABLET | Freq: Every day | ORAL | Status: DC

## 2015-04-02 MED ORDER — ENALAPRIL MALEATE 20 MG PO TABS: ORAL_TABLET | ORAL | Status: DC

## 2015-04-02 MED ORDER — NIFEDIPINE ER 30 MG PO TB24: 30.0000 mg | ORAL_TABLET | Freq: Every morning | ORAL | Status: DC

## 2015-04-02 MED ORDER — OMEPRAZOLE 40 MG PO CPDR: 40.0000 mg | DELAYED_RELEASE_CAPSULE | Freq: Every day | ORAL | Status: DC

## 2015-04-02 NOTE — Telephone Encounter (Signed)
meds refilled 

## 2015-04-04 ENCOUNTER — Telehealth: Payer: Self-pay

## 2015-04-04 NOTE — Telephone Encounter (Signed)
Advise elevate legs, reduce salt intake at appt next week I will furthwer address

## 2015-04-10 ENCOUNTER — Ambulatory Visit (INDEPENDENT_AMBULATORY_CARE_PROVIDER_SITE_OTHER): Payer: Medicare Other | Admitting: Family Medicine

## 2015-04-10 ENCOUNTER — Encounter: Payer: Self-pay | Admitting: Family Medicine

## 2015-04-10 ENCOUNTER — Other Ambulatory Visit: Payer: Self-pay | Admitting: Family Medicine

## 2015-04-10 VITALS — BP 140/68 | HR 62 | Resp 16 | Ht 66.0 in | Wt 121.8 lb

## 2015-04-10 DIAGNOSIS — I10 Essential (primary) hypertension: Secondary | ICD-10-CM

## 2015-04-10 DIAGNOSIS — Z23 Encounter for immunization: Secondary | ICD-10-CM | POA: Diagnosis not present

## 2015-04-10 DIAGNOSIS — J302 Other seasonal allergic rhinitis: Secondary | ICD-10-CM | POA: Diagnosis not present

## 2015-04-10 DIAGNOSIS — M7989 Other specified soft tissue disorders: Secondary | ICD-10-CM

## 2015-04-10 DIAGNOSIS — M79604 Pain in right leg: Secondary | ICD-10-CM

## 2015-04-10 DIAGNOSIS — D539 Nutritional anemia, unspecified: Secondary | ICD-10-CM | POA: Diagnosis not present

## 2015-04-10 DIAGNOSIS — H5462 Unqualified visual loss, left eye, normal vision right eye: Secondary | ICD-10-CM

## 2015-04-10 DIAGNOSIS — E785 Hyperlipidemia, unspecified: Secondary | ICD-10-CM | POA: Diagnosis not present

## 2015-04-10 DIAGNOSIS — E039 Hypothyroidism, unspecified: Secondary | ICD-10-CM

## 2015-04-10 LAB — COMPREHENSIVE METABOLIC PANEL
ALK PHOS: 149 U/L — AB (ref 39–117)
ALT: 14 U/L (ref 0–35)
AST: 20 U/L (ref 0–37)
Albumin: 4.4 g/dL (ref 3.5–5.2)
BUN: 20 mg/dL (ref 6–23)
CO2: 27 mEq/L (ref 19–32)
CREATININE: 1.03 mg/dL (ref 0.50–1.10)
Calcium: 10.3 mg/dL (ref 8.4–10.5)
Chloride: 102 mEq/L (ref 96–112)
Glucose, Bld: 95 mg/dL (ref 70–99)
Potassium: 4.2 mEq/L (ref 3.5–5.3)
Sodium: 139 mEq/L (ref 135–145)
Total Bilirubin: 0.5 mg/dL (ref 0.2–1.2)
Total Protein: 7.5 g/dL (ref 6.0–8.3)

## 2015-04-10 LAB — TSH: TSH: 0.936 u[IU]/mL (ref 0.350–4.500)

## 2015-04-10 LAB — CBC
HEMATOCRIT: 35.1 % — AB (ref 36.0–46.0)
Hemoglobin: 11.2 g/dL — ABNORMAL LOW (ref 12.0–15.0)
MCH: 28.9 pg (ref 26.0–34.0)
MCHC: 31.9 g/dL (ref 30.0–36.0)
MCV: 90.7 fL (ref 78.0–100.0)
MPV: 9.8 fL (ref 8.6–12.4)
Platelets: 363 10*3/uL (ref 150–400)
RBC: 3.87 MIL/uL (ref 3.87–5.11)
RDW: 15 % (ref 11.5–15.5)
WBC: 5.2 10*3/uL (ref 4.0–10.5)

## 2015-04-10 LAB — LIPID PANEL
Cholesterol: 206 mg/dL — ABNORMAL HIGH (ref 0–200)
HDL: 89 mg/dL (ref >=46)
LDL Cholesterol: 100 mg/dL — ABNORMAL HIGH (ref 0–99)
Total CHOL/HDL Ratio: 2.3 Ratio
Triglycerides: 84 mg/dL (ref <150)
VLDL: 17 mg/dL (ref 0–40)

## 2015-04-10 MED ORDER — FUROSEMIDE 20 MG PO TABS: ORAL_TABLET | ORAL | Status: AC

## 2015-04-10 MED ORDER — TRAMADOL HCL 50 MG PO TABS: 50.0000 mg | ORAL_TABLET | Freq: Two times a day (BID) | ORAL | Status: DC | PRN

## 2015-04-10 MED ORDER — MONTELUKAST SODIUM 10 MG PO TABS: 10.0000 mg | ORAL_TABLET | Freq: Every day | ORAL | Status: DC

## 2015-04-10 NOTE — Patient Instructions (Signed)
Annual wellness in 4 month, call if you need me before  New medications sent for allergies and leg swelling, use fluid pills very sparingly \ Prevnar today  CBC, lipid, cmp, TSH today  Keep working with eyes!  Thanks for choosing [facility], we consider it a privelige to serve you.

## 2015-04-10 NOTE — Telephone Encounter (Signed)
Patient in for office visit.  

## 2015-04-12 LAB — IRON: IRON: 33 ug/dL — AB (ref 42–145)

## 2015-04-12 LAB — FERRITIN: Ferritin: 18 ng/mL (ref 10–291)

## 2015-04-30 DIAGNOSIS — Z23 Encounter for immunization: Secondary | ICD-10-CM | POA: Insufficient documentation

## 2015-04-30 NOTE — Assessment & Plan Note (Signed)
Uncontrolled, resume tramadol twice daily

## 2015-04-30 NOTE — Assessment & Plan Note (Signed)
After obtaining informed consent, the vaccine is  administered by LPN.  

## 2015-04-30 NOTE — Assessment & Plan Note (Signed)
Uncontrolled , start daily singulair 

## 2015-04-30 NOTE — Assessment & Plan Note (Signed)
Controlled, no change in medication  

## 2015-04-30 NOTE — Progress Notes (Signed)
   Subjective:    Patient ID: Tina Goodwin, female    DOB: August 25, 1932, 81 y.o.   MRN: [id number]  HPI The PT is here for follow up and re-evaluation of chronic medical conditions, medication management and review of any available recent lab and radiology data.  Preventive health is updated, specifically  Cancer screening and Immunization.   Questions or concerns regarding consultations or procedures which the PT has had in the interim are  addressed. The PT denies any adverse reactions to current medications since the last visit.  C/o intermittent leg swelling and arthritic pain, wants medication for both Denies falls       Review of Systems See HPI Denies recent fever or chills. C/o increased  sinus pressure, nasal congestion, denies  ear pain or sore throat. Denies chest congestion, productive cough or wheezing. Denies chest pains, palpitations , PND or orthopnea Denies abdominal pain, nausea, vomiting,diarrhea or constipation.   Denies dysuria, frequency, hesitancy or incontinence. Denies headaches, seizures, numbness, or tingling. Denies depression, anxiety or insomnia. Denies skin break down or rash.        Objective:   Physical Exam BP 140/68 mmHg  Pulse 62  Resp 16  Ht 5\' 6"  (1.676 m)  Wt 121 lb 12.8 oz (55.248 kg)  BMI 19.67 kg/m2  SpO2 97% Patient alert and oriented and in no cardiopulmonary distress.  HEENT: No facial asymmetry, EOMI,   oropharynx pink and moist.  Neck supple no JVD, no mass.  Chest: Clear to auscultation bilaterally.  CVS: S1, S2 no murmurs, no S3.Regular rate.  ABD: Soft non tender.   Ext: No edema  MS: Decreased  ROM lumbar spine,adequate in  shoulders, hips and knees.  Skin: Intact, no ulcerations or rash noted.  Psych: Good eye contact, normal affect. Memory intact not anxious or depressed appearing.  CNS: CN 2-12 intact, power,  normal throughout.no focal deficits noted.        Assessment & Plan:  Leg  swelling Intermittent edema reported, none at time of exam, limited acccess to lasix and potassium as needed, also reduction of salt intake and leg elevation   Leg pain, right Uncontrolled, resume tramadol twice daily  Vision loss of left eye Ongoing eye care, pt feels as though no progress is being made, however I encourage her to strongly contiuie with current course  Hypothyroidism Controlled, no change in medication   Essential hypertension Controlled, no change in medication   Seasonal allergies Uncontrolled , start daily singulair  Hyperlipidemia Controlled, no change in medication, needs to reduce fat intake slightly Hyperlipidemia:Low fat diet discussed and encouraged.   Lipid Panel  Lab Results  Component Value Date   CHOL 206* 04/10/2015   HDL 89 04/10/2015   LDLCALC 100* 04/10/2015   TRIG 84 04/10/2015   CHOLHDL 2.3 04/10/2015        Need for vaccination with 13-polyvalent pneumococcal conjugate vaccine After obtaining informed consent, the vaccine is  administered by LPN.

## 2015-04-30 NOTE — Assessment & Plan Note (Signed)
Ongoing eye care, pt feels as though no progress is being made, however I encourage her to strongly contiuie with current course

## 2015-04-30 NOTE — Assessment & Plan Note (Signed)
Intermittent edema reported, none at time of exam, limited acccess to lasix and potassium as needed, also reduction of salt intake and leg elevation

## 2015-04-30 NOTE — Assessment & Plan Note (Signed)
Controlled, no change in medication, needs to reduce fat intake slightly Hyperlipidemia:Low fat diet discussed and encouraged.   Lipid Panel  Lab Results  Component Value Date   CHOL 206* 04/10/2015   HDL 89 04/10/2015   LDLCALC 100* 04/10/2015   TRIG 84 04/10/2015   CHOLHDL 2.3 04/10/2015

## 2015-05-11 ENCOUNTER — Telehealth: Payer: Self-pay | Admitting: Family Medicine

## 2015-05-11 NOTE — Telephone Encounter (Signed)
ALSO NEEDS BP MEDICINE

## 2015-05-15 ENCOUNTER — Encounter (INDEPENDENT_AMBULATORY_CARE_PROVIDER_SITE_OTHER): Payer: Medicare Other | Admitting: Ophthalmology

## 2015-05-15 DIAGNOSIS — H3532 Exudative age-related macular degeneration: Secondary | ICD-10-CM | POA: Diagnosis not present

## 2015-05-15 DIAGNOSIS — H43813 Vitreous degeneration, bilateral: Secondary | ICD-10-CM

## 2015-05-15 DIAGNOSIS — H3531 Nonexudative age-related macular degeneration: Secondary | ICD-10-CM

## 2015-05-15 DIAGNOSIS — I10 Essential (primary) hypertension: Secondary | ICD-10-CM

## 2015-05-15 DIAGNOSIS — D3131 Benign neoplasm of right choroid: Secondary | ICD-10-CM

## 2015-05-15 NOTE — Telephone Encounter (Signed)
Not many options open as far as fast scheduling.  Patient will continue to work on her own ride.   Noted that patient did make appointment.

## 2015-06-07 ENCOUNTER — Telehealth: Payer: Self-pay

## 2015-06-07 MED ORDER — HYDROXYZINE HCL 10 MG PO TABS
10.0000 mg | ORAL_TABLET | Freq: Three times a day (TID) | ORAL | Status: DC | PRN
Start: 2015-06-07 — End: 2016-05-23

## 2015-06-07 MED ORDER — ATORVASTATIN CALCIUM 10 MG PO TABS: 10.0000 mg | ORAL_TABLET | Freq: Every day | ORAL | Status: DC

## 2015-06-07 MED ORDER — EZETIMIBE 10 MG PO TABS: 10.0000 mg | ORAL_TABLET | Freq: Every day | ORAL | Status: DC

## 2015-06-07 NOTE — Telephone Encounter (Signed)
pls advise, get rid of bugs Calamine lotion to skin is soothing as is aveeno soap for baths  I have sent in hydroxyzine, care with this as it may make her sleepy, start with night time use only if needed

## 2015-06-08 NOTE — Telephone Encounter (Signed)
Patient aware.

## 2015-06-25 ENCOUNTER — Telehealth: Payer: Self-pay

## 2015-06-25 DIAGNOSIS — K869 Disease of pancreas, unspecified: Secondary | ICD-10-CM

## 2015-06-25 NOTE — Telephone Encounter (Signed)
-----   Message from Barron Alvine, [place] sent at 06/22/2014 12:52 PM EDT ----- repeat imaging of pancreas with MRI/MRCP in 12 months

## 2015-06-26 NOTE — Telephone Encounter (Signed)
You have been scheduled for an MRI at [facility] on 07/06/15 . Your appointment time is at  9 am. Please arrive 15 minutes prior to your appointment time for registration purposes. Please make certain not to have anything to eat or drink 6 hours prior to your test. In addition, if you have any metal in your body, have a pacemaker or defibrillator, please be sure to let your ordering physician know. This test typically takes 45 minutes to 1 hour to complete.8  Needs labs for procedure  Left message on machine at emergency contact and no answer and no voice mail on home number  to call back

## 2015-06-28 NOTE — Telephone Encounter (Signed)
Pt was contacted and states she does not wish to have any procedures at this time.  She says she is having surgery on her eyes and other health problems.  She will call if she changes her mind

## 2015-07-06 ENCOUNTER — Ambulatory Visit (HOSPITAL_COMMUNITY): Payer: Medicare Other

## 2015-07-10 ENCOUNTER — Encounter (INDEPENDENT_AMBULATORY_CARE_PROVIDER_SITE_OTHER): Payer: Medicare Other | Admitting: Ophthalmology

## 2015-07-10 DIAGNOSIS — H43813 Vitreous degeneration, bilateral: Secondary | ICD-10-CM | POA: Diagnosis not present

## 2015-07-10 DIAGNOSIS — H3532 Exudative age-related macular degeneration: Secondary | ICD-10-CM | POA: Diagnosis not present

## 2015-07-10 DIAGNOSIS — H35053 Retinal neovascularization, unspecified, bilateral: Secondary | ICD-10-CM | POA: Diagnosis not present

## 2015-07-10 DIAGNOSIS — H3531 Nonexudative age-related macular degeneration: Secondary | ICD-10-CM | POA: Diagnosis not present

## 2015-07-10 DIAGNOSIS — D3131 Benign neoplasm of right choroid: Secondary | ICD-10-CM | POA: Diagnosis not present

## 2015-07-11 ENCOUNTER — Other Ambulatory Visit: Payer: Self-pay

## 2015-07-11 MED ORDER — HYDROCHLOROTHIAZIDE 25 MG PO TABS: 25.0000 mg | ORAL_TABLET | Freq: Every morning | ORAL | Status: DC

## 2015-07-11 MED ORDER — ENALAPRIL MALEATE 20 MG PO TABS: ORAL_TABLET | ORAL | Status: DC

## 2015-07-11 MED ORDER — LEVOTHYROXINE SODIUM 50 MCG PO TABS: 50.0000 ug | ORAL_TABLET | Freq: Every day | ORAL | Status: DC

## 2015-07-11 MED ORDER — OMEPRAZOLE 40 MG PO CPDR
40.0000 mg | DELAYED_RELEASE_CAPSULE | Freq: Every day | ORAL | Status: DC
Start: 2015-07-11 — End: 2016-01-21

## 2015-08-14 ENCOUNTER — Telehealth: Payer: Self-pay | Admitting: *Deleted

## 2015-08-14 DIAGNOSIS — H5462 Unqualified visual loss, left eye, normal vision right eye: Secondary | ICD-10-CM

## 2015-08-14 NOTE — Telephone Encounter (Signed)
Tina Coello called to speak with Dr. Moshe Cipro I made her aware Dr. Moshe Cipro is seeing patient's, Tina Goodwin is requesting Dr. Moshe Cipro call her ASAP Tina Boardley states it is very important. Tina Kingsford stated it is about her eye doctor and she needs to speak with Dr. Moshe Cipro it is urgent that Dr. Moshe Cipro calls her back.

## 2015-08-14 NOTE — Telephone Encounter (Signed)
Patient states that she would like to go to another eye dr because she owes a balance with her current opthalmologist. She is asking to speak with md personally.   Will enter referral for 2nd opinion

## 2015-08-14 NOTE — Telephone Encounter (Signed)
I spoke directly with pt, pls type a note addressed to current eye specialist , stating that per pt request, she is referred to local eye specialist Dr Gershon Crane, and that she stated that she does not plan to keep appt scheduled next week, I will sign. And fax this to Md's Office, pls let officestaff/ nurse at the facility be aware that letter is being sent   Pls also specifically ask referral staff to get appt with  Dr Gershon Crane locally , if no Doc was named in the referral , if he does not accept her insurance then needs to try Dr Iona Hansen in [place], if still a problem let me know, thiose are the 2 local options, thanks  ??/pls ask

## 2015-08-14 NOTE — Telephone Encounter (Signed)
Please advise 

## 2015-08-14 NOTE — Telephone Encounter (Signed)
Mrs Ottaway called to speak with Dr. Moshe Cipro I made her aware Dr. Moshe Cipro is in a room right now, I asked if I could take a message and have her nurse call her back she stated she needs to speak with Dr. Moshe Cipro and it is very important and she rather Dr Moshe Cipro call her back to talk about it. [phone]

## 2015-08-15 ENCOUNTER — Telehealth: Payer: Self-pay | Admitting: *Deleted

## 2015-08-15 NOTE — Telephone Encounter (Signed)
Ms Bazar called stating she called Dr. Gershon Crane to reschedule her appt and they told her it is nothing they can do until they hear from Dr. Zigmund Daniel. Patient had a appt with Dr. Christell Faith at 2:45 but patient stated she needed to reschedule it due to the weather, Ms Dant called and she stated she can't reschedule until Dr. Gershon Crane hears something from Dr. Zigmund Daniel and she needs to sign a paper for Dr. Zigmund Daniel. Pt is requesting Dr. Moshe Cipro to call her back and Ms Baillie stated only wants to speak with Dr. Moshe Cipro.

## 2015-08-15 NOTE — Telephone Encounter (Signed)
Letter sent to Dr. Zigmund Daniel office.   Also referral entered to Dr. Gershon Crane for unspecified vision loss of left eye

## 2015-08-15 NOTE — Addendum Note (Signed)
Addended by: Denman George B on: 08/15/2015 09:53 AM   Modules accepted: Orders

## 2015-08-15 NOTE — Telephone Encounter (Signed)
Courtney spoke with patient

## 2015-08-22 ENCOUNTER — Other Ambulatory Visit: Payer: Self-pay

## 2015-08-22 DIAGNOSIS — H353113 Nonexudative age-related macular degeneration, right eye, advanced atrophic without subfoveal involvement: Secondary | ICD-10-CM | POA: Diagnosis not present

## 2015-08-22 MED ORDER — ENALAPRIL MALEATE 20 MG PO TABS: ORAL_TABLET | ORAL | Status: DC

## 2015-08-22 MED ORDER — HYDROCHLOROTHIAZIDE 25 MG PO TABS: 25.0000 mg | ORAL_TABLET | Freq: Every morning | ORAL | Status: DC

## 2015-08-22 MED ORDER — NIFEDIPINE ER 30 MG PO TB24: 30.0000 mg | ORAL_TABLET | Freq: Every morning | ORAL | Status: DC

## 2015-09-05 DIAGNOSIS — H40012 Open angle with borderline findings, low risk, left eye: Secondary | ICD-10-CM | POA: Diagnosis not present

## 2015-09-13 ENCOUNTER — Emergency Department (HOSPITAL_COMMUNITY)
Admission: EM | Admit: 2015-09-13 | Discharge: 2015-09-13 | Disposition: A | Discharge status: Home / Self Care | Payer: Medicare Other | Attending: Emergency Medicine | Admitting: Emergency Medicine

## 2015-09-13 ENCOUNTER — Encounter (HOSPITAL_COMMUNITY): Payer: Self-pay | Admitting: Emergency Medicine

## 2015-09-13 DIAGNOSIS — Z85850 Personal history of malignant neoplasm of thyroid: Secondary | ICD-10-CM | POA: Diagnosis not present

## 2015-09-13 DIAGNOSIS — F329 Major depressive disorder, single episode, unspecified: Secondary | ICD-10-CM | POA: Diagnosis not present

## 2015-09-13 DIAGNOSIS — Z9889 Other specified postprocedural states: Secondary | ICD-10-CM | POA: Insufficient documentation

## 2015-09-13 DIAGNOSIS — Z79899 Other long term (current) drug therapy: Secondary | ICD-10-CM | POA: Insufficient documentation

## 2015-09-13 DIAGNOSIS — Z8719 Personal history of other diseases of the digestive system: Secondary | ICD-10-CM | POA: Diagnosis not present

## 2015-09-13 DIAGNOSIS — Z8781 Personal history of (healed) traumatic fracture: Secondary | ICD-10-CM | POA: Insufficient documentation

## 2015-09-13 DIAGNOSIS — E039 Hypothyroidism, unspecified: Secondary | ICD-10-CM | POA: Insufficient documentation

## 2015-09-13 DIAGNOSIS — R109 Unspecified abdominal pain: Secondary | ICD-10-CM | POA: Diagnosis not present

## 2015-09-13 DIAGNOSIS — Z87891 Personal history of nicotine dependence: Secondary | ICD-10-CM | POA: Diagnosis not present

## 2015-09-13 DIAGNOSIS — I10 Essential (primary) hypertension: Secondary | ICD-10-CM | POA: Insufficient documentation

## 2015-09-13 DIAGNOSIS — E785 Hyperlipidemia, unspecified: Secondary | ICD-10-CM | POA: Diagnosis not present

## 2015-09-13 DIAGNOSIS — J449 Chronic obstructive pulmonary disease, unspecified: Secondary | ICD-10-CM | POA: Diagnosis not present

## 2015-09-13 DIAGNOSIS — Z87448 Personal history of other diseases of urinary system: Secondary | ICD-10-CM | POA: Insufficient documentation

## 2015-09-13 DIAGNOSIS — H0289 Other specified disorders of eyelid: Secondary | ICD-10-CM

## 2015-09-13 DIAGNOSIS — Z862 Personal history of diseases of the blood and blood-forming organs and certain disorders involving the immune mechanism: Secondary | ICD-10-CM | POA: Diagnosis not present

## 2015-09-13 DIAGNOSIS — H578 Other specified disorders of eye and adnexa: Secondary | ICD-10-CM | POA: Diagnosis not present

## 2015-09-13 DIAGNOSIS — H5710 Ocular pain, unspecified eye: Secondary | ICD-10-CM | POA: Diagnosis present

## 2015-09-13 DIAGNOSIS — H019 Unspecified inflammation of eyelid: Secondary | ICD-10-CM | POA: Diagnosis not present

## 2015-09-13 MED ORDER — TOBRAMYCIN 0.3 % OP SOLN: 2.0000 [drp] | OPHTHALMIC | Status: AC

## 2015-09-13 MED ORDER — FLUORESCEIN SODIUM 1 MG OP STRP
ORAL_STRIP | OPHTHALMIC | Status: AC
  Filled 2015-09-13: qty 1

## 2015-09-13 MED ORDER — TETRACAINE HCL 0.5 % OP SOLN
OPHTHALMIC | Status: AC
  Filled 2015-09-13: qty 2

## 2015-09-13 NOTE — ED Provider Notes (Signed)
CSN: [id number]     Arrival date & time 09/13/15  1011 History   First MD Initiated Contact with Patient 09/13/15 1023     Chief Complaint  Patient presents with  . Eye Pain     (Consider location/radiation/quality/duration/timing/severity/associated sxs/prior Treatment) Patient is a 81 y.o. female presenting with eye pain. The history is provided by the patient. No language interpreter was used.  Eye Pain This is a new problem. The current episode started yesterday. The problem occurs constantly. The problem has been unchanged. Associated symptoms include abdominal pain. Nothing aggravates the symptoms. She has tried nothing for the symptoms. The treatment provided no relief.  Pt reports eye feels itchy,  Pt reports she feels like she has something in her eye  Past Medical History  Diagnosis Date  . Allergic rhinitis   . Depression   . Hyperlipidemia   . Uncontrolled stage 2 hypertension   . Numbness of foot     Bilateral   . Cholecystitis chronic   . Right forearm fracture   . Dysuria   . Anemia, iron deficiency   . Hypothyroidism   . Renal mass   . NASH (nonalcoholic steatohepatitis)   . COPD (chronic obstructive pulmonary disease) ([place])   . Thyroid cancer [facility])    Past Surgical History  Procedure Laterality Date  . Vesicovaginal fistula closure w/ tah    . Thyroidectomy    . Back surgery      x2  . Right cataract extraction    . Left cataract extraction    . Eye surgery Left     [facility] and [place] surgery Right   . Spine surgery      two times  . Eus N/A 06/15/2014    Procedure: UPPER ENDOSCOPIC ULTRASOUND (EUS) LINEAR;  Surgeon: Milus Banister, MD;  Location: [facility] END[facility];  Service: Endoscopy;  Laterality: N/A;   Family History  Problem Relation Age of Onset  . Cancer Mother   . Hypertension Mother   . Cancer Father   . Hypertension Father   . Colon cancer Neg Hx    Social History  Substance Use Topics  . Smoking status: Former Smoker    Types:  Cigarettes    Quit date: 07/31/2001  . Smokeless tobacco: Never Used  . Alcohol Use: No   OB History    No data available     Review of Systems  Eyes: Positive for pain.  Gastrointestinal: Positive for abdominal pain.  All other systems reviewed and are negative.     Allergies  Review of patient's allergies indicates no known allergies.  Home Medications   Prior to Admission medications   Medication Sig Start Date End Date Taking? Authorizing Provider  acetaminophen (TYLENOL) 500 MG tablet Take 500 mg by mouth every 6 (six) hours as needed for mild pain.   Yes Historical Provider, MD  atorvastatin (LIPITOR) 10 MG tablet Take 1 tablet (10 mg total) by mouth daily. 06/07/15  Yes Fayrene Helper, MD  enalapril (VASOTEC) 20 MG tablet 2 tabs daily Patient taking differently: Take 20 mg by mouth daily. 2 tabs daily 08/22/15  Yes Fayrene Helper, MD  ezetimibe (ZETIA) 10 MG tablet Take 1 tablet (10 mg total) by mouth at bedtime. 06/07/15  Yes Fayrene Helper, MD  furosemide (LASIX) 20 MG tablet One tablet once daily , as needed, for leg swelling, do not use more than 3 tablets in any one week 04/10/15  Yes Fayrene Helper,  MD  hydrochlorothiazide (HYDRODIURIL) 25 MG tablet Take 1 tablet (25 mg total) by mouth every morning. 08/22/15  Yes Fayrene Helper, MD  hydrOXYzine (ATARAX/VISTARIL) 10 MG tablet Take 1 tablet (10 mg total) by mouth 3 (three) times daily as needed. 06/07/15  Yes Fayrene Helper, MD  levothyroxine (SYNTHROID, LEVOTHROID) 50 MCG tablet Take 1 tablet (50 mcg total) by mouth daily before breakfast. 07/11/15  Yes Fayrene Helper, MD  montelukast (SINGULAIR) 10 MG tablet Take 1 tablet (10 mg total) by mouth at bedtime. 04/10/15  Yes Fayrene Helper, MD  Multiple Vitamin (MULTIVITAMIN WITH MINERALS) TABS tablet Take 1 tablet by mouth daily.   Yes Historical Provider, MD  NIFEdipine (PROCARDIA-XL/ADALAT CC) 30 MG 24 hr tablet Take 1 tablet (30 mg total) by  mouth every morning. 08/22/15  Yes Fayrene Helper, MD  omeprazole (PRILOSEC) 40 MG capsule Take 1 capsule (40 mg total) by mouth daily. 07/11/15  Yes Fayrene Helper, MD  traMADol (ULTRAM) 50 MG tablet Take 1 tablet (50 mg total) by mouth 2 (two) times daily as needed. 04/10/15  Yes Fayrene Helper, MD  tobramycin (TOBREX) 0.3 % ophthalmic solution Place 2 drops into the left eye every 4 (four) hours. 09/13/15   Hollace Kinnier Sofia, PA-C   BP 147/70 mmHg  Pulse 68  Temp(Src) 98 F (36.7 C) (Oral)  Resp 16  Ht 5\' 7"  (1.702 m)  Wt 125 lb (56.7 kg)  BMI 19.57 kg/m2  SpO2 100% Physical Exam  Constitutional: She appears well-developed and well-nourished.  HENT:  Head: Normocephalic and atraumatic.  Right Ear: External ear normal.  Left Ear: External ear normal.  Eyes: Conjunctivae and EOM are normal. Pupils are equal, round, and reactive to light. Right eye exhibits discharge. Left eye exhibits discharge.  fluro uptake  No foreign body,  Cardiovascular: Normal rate.   Pulmonary/Chest: Effort normal.  Neurological: She is alert.  Skin: Skin is warm.  Nursing note and vitals reviewed.   ED Course  Procedures (including critical care time) Labs Review Labs Reviewed - No data to display  Imaging Review No results found. I have personally reviewed and evaluated these images and lab results as part of my medical decision-making.   EKG Interpretation None      MDM presures 12 on left 16 on 8   Final diagnoses:  Irritation of eyelid    Tobrex Pt advised to call Dr. Her eye docor Dr. Claudie Leach to be seen for [place], PA-C 09/13/15 1112  Virgel Manifold, MD 09/18/15 [phone]

## 2015-09-13 NOTE — Discharge Instructions (Signed)

## 2015-09-13 NOTE — ED Notes (Signed)
Pt c/o of LT sided "stabbing" eye pain since yesterday. Pt reports mild vision changes. Reports she has had 15 surgeries on LT eye. Denies injury/foreign body.

## 2015-09-15 ENCOUNTER — Telehealth: Payer: Self-pay | Admitting: Family Medicine

## 2015-09-15 NOTE — Telephone Encounter (Signed)
Pls call pt , let her know that I reviewed the info she left. As we are aware , this patient has significant health and financial  challenges which she is attempting unsuccessfully to handle on her own, MOST specifically, visual problems Offer meeting with a social worker from [facility] which can be held in the office if the pt is more comfortable with this please and if the [facility] team is willing/ able to help, she does need some help Copy of form left is in your work space for reference Thanks

## 2015-09-18 ENCOUNTER — Encounter (INDEPENDENT_AMBULATORY_CARE_PROVIDER_SITE_OTHER): Payer: Self-pay | Admitting: Ophthalmology

## 2015-09-18 NOTE — Telephone Encounter (Signed)
Spoke with patient and she states that she does not want to make a decision at this time.  She states that she has already signed up for her Medicare benefits and believes that she has all the benefits of vision and dental for free included in her insurance.   She will not make an appointment at this time and will call back when she is ready.

## 2015-09-20 ENCOUNTER — Encounter: Payer: Self-pay | Admitting: Family Medicine

## 2015-09-25 ENCOUNTER — Ambulatory Visit (INDEPENDENT_AMBULATORY_CARE_PROVIDER_SITE_OTHER): Payer: Medicare Other | Admitting: Family Medicine

## 2015-09-25 ENCOUNTER — Telehealth: Payer: Self-pay | Admitting: Family Medicine

## 2015-09-25 ENCOUNTER — Encounter: Payer: Self-pay | Admitting: Family Medicine

## 2015-09-25 ENCOUNTER — Other Ambulatory Visit: Payer: Self-pay | Admitting: Family Medicine

## 2015-09-25 VITALS — BP 124/68 | HR 82 | Resp 16 | Ht 67.0 in | Wt 110.1 lb

## 2015-09-25 DIAGNOSIS — M549 Dorsalgia, unspecified: Secondary | ICD-10-CM | POA: Diagnosis not present

## 2015-09-25 DIAGNOSIS — D649 Anemia, unspecified: Secondary | ICD-10-CM | POA: Diagnosis not present

## 2015-09-25 DIAGNOSIS — H5462 Unqualified visual loss, left eye, normal vision right eye: Secondary | ICD-10-CM

## 2015-09-25 DIAGNOSIS — I10 Essential (primary) hypertension: Secondary | ICD-10-CM | POA: Diagnosis not present

## 2015-09-25 DIAGNOSIS — E039 Hypothyroidism, unspecified: Secondary | ICD-10-CM | POA: Diagnosis not present

## 2015-09-25 DIAGNOSIS — E46 Unspecified protein-calorie malnutrition: Secondary | ICD-10-CM

## 2015-09-25 DIAGNOSIS — R634 Abnormal weight loss: Secondary | ICD-10-CM

## 2015-09-25 DIAGNOSIS — K029 Dental caries, unspecified: Secondary | ICD-10-CM

## 2015-09-25 LAB — COMPREHENSIVE METABOLIC PANEL
ALT: 8 U/L (ref 6–29)
AST: 14 U/L (ref 10–35)
Albumin: 4.1 g/dL (ref 3.6–5.1)
Alkaline Phosphatase: 134 U/L — ABNORMAL HIGH (ref 33–130)
BUN: 17 mg/dL (ref 7–25)
CALCIUM: 10.8 mg/dL — AB (ref 8.6–10.4)
CHLORIDE: 101 mmol/L (ref 98–110)
CO2: 25 mmol/L (ref 20–31)
Creat: 0.86 mg/dL (ref 0.60–0.88)
GLUCOSE: 84 mg/dL (ref 65–99)
POTASSIUM: 3.9 mmol/L (ref 3.5–5.3)
Sodium: 139 mmol/L (ref 135–146)
Total Bilirubin: 0.7 mg/dL (ref 0.2–1.2)
Total Protein: 7.1 g/dL (ref 6.1–8.1)

## 2015-09-25 LAB — CBC
HCT: 29.1 % — ABNORMAL LOW (ref 36.0–46.0)
Hemoglobin: 9.6 g/dL — ABNORMAL LOW (ref 12.0–15.0)
MCH: 28.9 pg (ref 26.0–34.0)
MCHC: 33 g/dL (ref 30.0–36.0)
MCV: 87.7 fL (ref 78.0–100.0)
MPV: 9.8 fL (ref 8.6–12.4)
PLATELETS: 314 10*3/uL (ref 150–400)
RBC: 3.32 MIL/uL — ABNORMAL LOW (ref 3.87–5.11)
RDW: 15.2 % (ref 11.5–15.5)
WBC: 5.9 10*3/uL (ref 4.0–10.5)

## 2015-09-25 LAB — TSH: TSH: 0.451 u[IU]/mL (ref 0.350–4.500)

## 2015-09-25 MED ORDER — MEGESTROL ACETATE 40 MG PO TABS: 40.0000 mg | ORAL_TABLET | Freq: Every day | ORAL | Status: DC

## 2015-09-25 MED ORDER — LEVOTHYROXINE SODIUM 50 MCG PO TABS: 50.0000 ug | ORAL_TABLET | Freq: Every day | ORAL | Status: DC

## 2015-09-25 MED ORDER — KETOROLAC TROMETHAMINE 60 MG/2ML IM SOLN
60.0000 mg | Freq: Once | INTRAMUSCULAR | Status: AC
  Administered 2015-09-25: 30 mg via INTRAMUSCULAR

## 2015-09-25 MED ORDER — PREDNISONE 5 MG PO TABS: 5.0000 mg | ORAL_TABLET | Freq: Two times a day (BID) | ORAL | Status: DC

## 2015-09-25 MED ORDER — TRAMADOL HCL 50 MG PO TABS: 50.0000 mg | ORAL_TABLET | Freq: Two times a day (BID) | ORAL | Status: DC | PRN

## 2015-09-25 MED ORDER — KETOROLAC TROMETHAMINE 30 MG/ML IJ SOLN: 30.0000 mg | Freq: Once | INTRAMUSCULAR | Status: DC

## 2015-09-25 MED ORDER — METHYLPREDNISOLONE ACETATE 40 MG/ML IJ SUSP
40.0000 mg | Freq: Once | INTRAMUSCULAR | Status: AC
  Administered 2015-09-25: 40 mg via INTRAMUSCULAR

## 2015-09-25 NOTE — Telephone Encounter (Signed)
Med refilled.

## 2015-09-25 NOTE — Patient Outreach (Signed)
[facility]) Care Management  09/25/2015  Tina Goodwin 03/06/1932 [id number]   Request from MD to assign Community RN and SW, assigned Janalyn Shy, RN and [employer], [place].  Thanks, Ronnell Freshwater. [place], [place] Assistant Phone: [phone] Fax: [phone]

## 2015-09-25 NOTE — Progress Notes (Signed)
Subjective:    Patient ID: Tina Goodwin, female    DOB: May 18, 1932, 81 y.o.   MRN: [id number]  HPI Pt in today stating that she was told by me that she cannot get free dental and eye services free, also EVERYTHING free including medication, asking me to put this on her prescriptions as well. States she is unable to chew which is why she continues to lose weight , refuses an attempt to make both a dental and sooner eye appt while in office. She has not filled script fore eye drops recently received from [facility] and states med prescribed will worsen her vision C/o back and rigth lower ext pain, wants medication for this, when she was to leave stumbled several times reporting intense pain, waited for over 2 hrs in the lobby for a ride home then got home on foot on her own    Review of Systems See HPI Denies recent fever or chills. Denies sinus pressure, nasal congestion, ear pain or sore throat. Denies chest congestion, productive cough or wheezing. Denies chest pains, palpitations and leg swelling Denies abdominal pain, nausea, vomiting,diarrhea or constipation.   Denies dysuria, frequency, hesitancy or incontinence.  Denies headaches, seizures, numbness, or tingling. . Denies skin break down or rash.        Objective:   Physical Exam BP 124/68 mmHg  Pulse 82  Resp 16  Ht 5\' 7"  (1.702 m)  Wt 110 lb 1.9 oz (49.95 kg)  BMI 17.24 kg/m2  SpO2 97%  Patient alert and oriented and in no cardiopulmonary distress.Irritated, anxious and scared, states I have provided her with false information that her dental and vision care are free and that I told her otherwise  HEENT: No facial asymmetry, EOMI,   oropharynx pink and moist.  Neck supple no JVD, no mass. Few teeth in her mouth, and they are shaking and partially dislodged, bitemporal wasting Chest: Clear to auscultation bilaterally.  CVS: S1, S2 no murmurs, no S3.Regular rate.  ABD: Soft non tender.   Ext: No edema  MS:  decreased  ROM lumbar  Spine, adequate in  shoulders, hips and knees.  Skin: Intact, no ulcerations or rash noted.  Psych: Good eye contact, . Memory impaired, confused, anxious and  depressed appearing.  CNS: CN 2-12 intact, power,  normal throughout.no focal deficits noted.        Assessment & Plan:  Back pain with radiation Uncontrolled.Toradol and depo medrol administered IM in the office , to be followed by a short course of oral prednisone and NSAIDS. Tramadol twice daily as needed, limited quantiti also prescribed   Vision loss of left eye Ongoing soure of distress to pt and seems to be worsening. Having great difficulty navigating health system on her own, continues to resist reachihg out to her children for help,m and is becoming somewhat angry and disgruntled with her care / lack thereof as she sees it. States everything should be free based on info as she understands this and this is not the case  Essential hypertension Controlled, no change in medication   Hypothyroidism Controlled, no change in medication   Dental caries Patient's few remaining teeth are infected and shaky, she attributes her weight loss to inability  To eat,  Is adamant that her dental care shoud bve free, and resisted my attempot to specifically set up an appt with her dentist before she left  Malnutrition ([place]) Pt sites poor dentition as the prime reason for ongoing weight losss. Refuses liquid  supplement like ensure states doesn't taste good and she wants to eat  food  Unintentional weight loss Significant weight loss in short time, megace started, social work/ [facility] for assistance, pt is fragile with no support, refuses to involve her children, both of which live out of state

## 2015-09-25 NOTE — Assessment & Plan Note (Addendum)
Uncontrolled.Toradol and depo medrol administered IM in the office , to be followed by a short course of oral prednisone and NSAIDS. Tramadol twice daily as needed, limited quantiti also prescribed

## 2015-09-25 NOTE — Telephone Encounter (Signed)
Patient has questions about her medication levothyroxine (SYNTHROID, LEVOTHROID) 50 MCG tablet  Please advise?

## 2015-09-25 NOTE — Patient Instructions (Addendum)
F/u in 5 weeks, call if you need me before  Injections today for back and left lower leg and 5 day course of prednisone , also tramadol pain pills for twice daily use as needed, limited supply  New medciation to help with weight gain is megace one daily for a limited time  If you continue to lose weight you will need to see a GI specialist  Labs today due to excess weight loss.  I have offered to obtain ensure and schedule dental appt for you due to shaky tooth , poor dentition and weight loss, and you have refused both  You continue to c/o loss of vision , state you need glasses, have not filled script recently provided at ED, I offer to get a "sooner appt " with Dr Gershon Crane, you refuse stating he is not there   You  have agreed to meet with a social worker here in the office to see how your health care needs  Can be met. You believe that all services are free, and you have a lot of need, we are here to help as much as we are able to , to get the help that yopu need  It will be very helpful, if you would also have a close family member, like your daughter, try to discuss and work with the Education officer, museum , so that everyone is working together in your best interest , and that concerns areas fully addressed and  clearly

## 2015-09-26 ENCOUNTER — Telehealth: Payer: Self-pay | Admitting: Family Medicine

## 2015-09-26 LAB — IRON: IRON: 44 ug/dL — AB (ref 45–160)

## 2015-09-26 LAB — FERRITIN: FERRITIN: 13 ng/mL (ref 10–291)

## 2015-09-26 NOTE — Telephone Encounter (Signed)
Patient didn't need anything from you. Just asked a question about her med and seemed to be in a very happy mood

## 2015-09-26 NOTE — Telephone Encounter (Signed)
Attempt made to return patient's call, no response and unable to lv a message Note had been routed to me from staff that pt wished to s[peak directly with me around 11 am today

## 2015-09-27 ENCOUNTER — Encounter: Payer: Self-pay | Admitting: Licensed Clinical Social Worker

## 2015-09-27 ENCOUNTER — Other Ambulatory Visit: Payer: Self-pay | Admitting: *Deleted

## 2015-09-28 ENCOUNTER — Other Ambulatory Visit: Payer: Self-pay | Admitting: *Deleted

## 2015-09-28 NOTE — Patient Outreach (Signed)
[facility]) Care Management  09/28/2015  Tina Goodwin [birth year] [id number]  I spoke with Tina Goodwin by phone today in response to a referral from Kate Sable LPN at [facility]'s office. Tina Goodwin needs assistance with accessing her [facility] Medicare benefits. She is most concerned about dental care and other maintenance care such as eye exam. Tina Goodwin is concerned about weight loss and believes this is due to her poor dentition and having to walk to all her appointments.   [name].    [place] Management  [phone]

## 2015-10-01 ENCOUNTER — Ambulatory Visit: Payer: Self-pay | Admitting: *Deleted

## 2015-10-02 DIAGNOSIS — T1502XA Foreign body in cornea, left eye, initial encounter: Secondary | ICD-10-CM | POA: Diagnosis not present

## 2015-10-06 DIAGNOSIS — K029 Dental caries, unspecified: Secondary | ICD-10-CM | POA: Insufficient documentation

## 2015-10-06 NOTE — Assessment & Plan Note (Signed)
Ongoing soure of distress to pt and seems to be worsening. Having great difficulty navigating health system on her own, continues to resist reachihg out to her children for help,m and is becoming somewhat angry and disgruntled with her care / lack thereof as she sees it. States everything should be free based on info as she understands this and this is not the case

## 2015-10-06 NOTE — Assessment & Plan Note (Signed)
Controlled, no change in medication  

## 2015-10-07 ENCOUNTER — Telehealth: Payer: Self-pay | Admitting: Family Medicine

## 2015-10-07 DIAGNOSIS — E46 Unspecified protein-calorie malnutrition: Secondary | ICD-10-CM | POA: Insufficient documentation

## 2015-10-07 DIAGNOSIS — R634 Abnormal weight loss: Secondary | ICD-10-CM | POA: Insufficient documentation

## 2015-10-07 NOTE — Assessment & Plan Note (Signed)
Significant weight loss in short time, megace started, social work/ [facility] contacted for assistance, pt is fragile with no support, refuses to involve her children, both of which live out of state

## 2015-10-07 NOTE — Assessment & Plan Note (Signed)
Pt sites poor dentition as the prime reason for ongoing weight losss. Refuses liquid supplement like ensure states doesn't taste good and she wants to eat  food

## 2015-10-07 NOTE — Telephone Encounter (Signed)
Pls follow up on planned visit by Alisa for last Monday 11/14 with Kina at her home I think it may not have happened, and am hoping that she does not continue to elude the help that she desperately needs. I am concerned. Pls send me back a tele  Message on this when you get the info, thanks Pls check with Delrae Rend first multiple sclerosis Scholle seems to be declining quite a bit and her reliability as a historian is questionable

## 2015-10-07 NOTE — Assessment & Plan Note (Signed)
Patient's few remaining teeth are infected and shaky, she attributes her weight loss to inability  To eat,  Is adamant that her dental care shoud bve free, and resisted my attempot to specifically set up an appt with her dentist before she left

## 2015-10-08 NOTE — Telephone Encounter (Signed)
Tina Goodwin said she declined to meet with her and said she only needed her for her eyes and dental care anyways. She is going to check with her insurance to see how much they will cover and try to follow up with her again. I will let you know when I hear anything

## 2015-10-10 DIAGNOSIS — H40012 Open angle with borderline findings, low risk, left eye: Secondary | ICD-10-CM | POA: Diagnosis not present

## 2015-10-10 DIAGNOSIS — H04123 Dry eye syndrome of bilateral lacrimal glands: Secondary | ICD-10-CM | POA: Diagnosis not present

## 2015-10-15 NOTE — Telephone Encounter (Signed)
thanks

## 2015-10-24 ENCOUNTER — Other Ambulatory Visit: Payer: Self-pay | Admitting: Licensed Clinical Social Worker

## 2015-10-24 ENCOUNTER — Encounter: Payer: Self-pay | Admitting: Licensed Clinical Social Worker

## 2015-10-24 NOTE — Patient Outreach (Signed)
Assessment: CSW had received referral on client.  CSW had spoken with RN Janalyn Shy about current client needs and fact that Dr. Moshe Cipro had called CSW recently to discuss client needs.  RN Janalyn Shy has been trying to reach client via phone.  CSW called client on 10/24/15 and spoke via phone with client on 10/24/15. CSW verified identity of client.  CSW introduced self to client and informed client that Dr. Moshe Cipro had called CSW recently and had referred client recently to [facility] program services for support. Client gave verbal permission to [place] on 10/24/15 for CSW to talk with client about current needs of client. RN Janalyn Shy was also referred to assist client with nursing needs. CSW informed client that both CSW and RN Janalyn Shy had been referred to assist client.  CSW informed Iasha that [facility] consent form would be mailed to her home for review and completion.  Mayumi said she understood this information.  Babbette seemed to prefer for care providers to meet her at office of Dr. Moshe Cipro during regular appointment time with Dr. Moshe Cipro. CSW informed Vadis that Dr. Moshe Cipro had mentioned this to CSW during recent call.  CSW offered for CSW to meet client at office of Dr. Moshe Cipro, if able, during time of regular client appointment with Dr. Moshe Cipro. CSW asked if client had upcoming scheduled appointment with Dr Moshe Cipro. [facility] said she does not have a current scheduled appointment with Dr. Moshe Cipro.  CSW and client completed needed [facility] assessments for client.  CSW spoke with client about care plan goal for client.  Client said she will try to attend all her scheduled medical appointments for client in next 30 days.  CSW spoke with client about her transport needs and financial challenges.  CSW encouraged client to attend scheduled medical appointments and to call CSW at [phone] as needed to discuss social work needs of client.  CSW thanked [place] for phone call with CSW on 10/24/15.  Plan: Client to attend  all scheduled medical appointments for client in next 30 days. CSW to call client in two weeks to assess needs of client.  Norva Riffle.Decarlos Empey MSW, LCSW Licensed Clinical Social Worker [facility] Care Management [phone]

## 2015-10-26 NOTE — Addendum Note (Signed)
Addended by: Eual Fines on: 10/26/2015 09:49 AM   Modules accepted: Orders

## 2015-10-29 ENCOUNTER — Encounter: Payer: Self-pay | Admitting: Internal Medicine

## 2015-10-31 ENCOUNTER — Ambulatory Visit: Payer: Self-pay | Admitting: Family Medicine

## 2015-10-31 ENCOUNTER — Encounter: Payer: Self-pay | Admitting: Family Medicine

## 2015-11-07 ENCOUNTER — Other Ambulatory Visit: Payer: Self-pay | Admitting: Licensed Clinical Social Worker

## 2015-11-07 ENCOUNTER — Ambulatory Visit: Payer: Self-pay | Admitting: Family Medicine

## 2015-11-07 ENCOUNTER — Encounter: Payer: Self-pay | Admitting: Family Medicine

## 2015-11-07 NOTE — Patient Outreach (Signed)
Assessment:  CSW called [facility] on 11/07/15 and spoke via phone with Marylin Crosby, receptionist at practice. CSW verified identity of Marylin Crosby at practice of Dr. Moshe Cipro. CSW introduced self and spoke with Marylin Crosby about client participation in scheduled appointments with Dr. Moshe Cipro. Marylin Crosby said that client had appointment scheduled for 10/31/15 with Dr. Moshe Cipro but that client had called to cancel that appointment with Dr. Moshe Cipro.  Marylin Crosby said client later came to office of Dr. Moshe Cipro on 10/31/15 about another medical matter. Marylin Crosby mentioned to [facility] that she had cancelled 10/31/15 appointment with Dr. Moshe Cipro. LeAnn rescheduled client appointment with Dr. Moshe Cipro for 11/07/15 at 1:15 PM. Marylin Crosby gave client card with date and time of appointment on card. Marylin Crosby said that client did not attend scheduled medical appointment with Dr. Moshe Cipro on 11/07/15.  CSW thanked [place] for update information regarding client on 11/07/15.  CSW then called home phone number of client on 11/07/15. CSW was not able to speak via phone with client on 11/07/15.Marland Kitchen Client did not have answering machine set up for CSW to leave phone message for client.   Plan: Client to attend scheduled medical appointments for client with Dr. Moshe Cipro. CSW to call client in two weeks to assess needs of client at that time.  Norva Riffle.Sahana Boyland MSW, LCSW Licensed Clinical Social Worker [facility] Care Management [phone]

## 2015-11-15 ENCOUNTER — Ambulatory Visit: Payer: Self-pay | Admitting: Gastroenterology

## 2015-11-21 ENCOUNTER — Other Ambulatory Visit: Payer: Self-pay

## 2015-11-21 ENCOUNTER — Telehealth: Payer: Self-pay | Admitting: Family Medicine

## 2015-11-21 ENCOUNTER — Other Ambulatory Visit: Payer: Self-pay | Admitting: Licensed Clinical Social Worker

## 2015-11-21 ENCOUNTER — Encounter: Payer: Self-pay | Admitting: Licensed Clinical Social Worker

## 2015-11-21 MED ORDER — NIFEDIPINE ER 30 MG PO TB24: 30.0000 mg | ORAL_TABLET | Freq: Every morning | ORAL | Status: DC

## 2015-11-21 NOTE — Patient Outreach (Signed)
Assessment:  CSW called home phone number several times for client on 11/21/15.  CSW was not able to speak via phone to client nor was answering machine set up for client phone.  CSW was not able to leave phone message for client.  CSW will again try to reach client via phone at later time. Dr. Moshe Cipro has been concerned over client related to client's unwillingness to access needed support services. Client has missed several medical appointments with Dr. Moshe Cipro in recent weeks. Client has talked to RN Janalyn Shy via phone and has talked via phone to [place]. However, client was not willing to meet RN Janalyn Shy at home of client for a home visit with RN.  Dr. Moshe Cipro had suggested that [facility] staff may meet with client at office of Dr. Moshe Cipro when client has appointment with Dr. Moshe Cipro. However, client has either cancelled or been a "no show" for last two scheduled medical appointments with Dr. Dorothea Ogle. Dr. Moshe Cipro has expressed concern related to client and multiple sclerosis issues of client.  RN Janalyn Shy has discussed with CSW her difficulty in maintaining communication with client. CSW spoke with RN Janalyn Shy on 11/21/15 about challenges in communicating with client.    Plan: Client to attend scheduled medical appointments for client in next 30 days. CSW to collaborate with RN Janalyn Shy in monitoring needs of client. CSW to call client in two weeks to attempt to communicate with client at that time.  Norva Riffle.Katlen Seyer MSW, LCSW Licensed Clinical Social Worker [facility] Care Management [phone]

## 2015-11-21 NOTE — Telephone Encounter (Signed)
Medication refilled

## 2015-11-21 NOTE — Telephone Encounter (Signed)
Patient is asking for a refill on medications NIFEdipine (PROCARDIA-XL/ADALAT CC) 30 MG 24 hr tablet please advise?

## 2015-12-05 ENCOUNTER — Encounter: Payer: Self-pay | Admitting: Licensed Clinical Social Worker

## 2015-12-05 ENCOUNTER — Other Ambulatory Visit: Payer: Self-pay | Admitting: Licensed Clinical Social Worker

## 2015-12-05 NOTE — Patient Outreach (Signed)
Assessment:  CSW called home phone number of client on 12/05/15. CSW spoke via phone with client on 12/05/15.  CSW verified client identity. Client gave CSW verbal permission on 12/05/15 for CSW to talk with client about current needs of client. CSW talked with client about client care plan. Client said she had missed several of her appointments with Dr. Moshe Cipro.  She spoke of transport needs.  CSW encouraged Tina Goodwin to call [place] to add her name to transport list for that agency. CSW reviewed with client that normal transport cost via agency with [facility] is $3.00 each way within [facility], [place] informed client that once she is on transport list with [facility] client would need to call that agency at least 3 days prior to scheduled appointment to schedule transport assist for client to appointment. Client said she understood this information. She said that sometimes her friends also help transport her to scheduled appointments. CSW encouraged client to attend scheduled medical appointments for client with Dr. Moshe Cipro in next 30 days. CSW encouraged client to call [facility] and add her name to transport list with [facility] agency. Client said she understood this information. CSW informed Tina Goodwin that RN Janalyn Shy was trying to contact client via phone to discuss nursing needs of client.  Client has requested that she not have any home visits with care providers. Dr. Moshe Cipro had suggested that care provider could meet with client at office of Dr. Moshe Cipro when client has next medical appointment with Dr. Moshe Cipro.  Unfortunately, client has either cancelled or been a no show for several recent appointments at office of Dr. Moshe Cipro. Client said she went to specialist in the Fall of last year and did not want to return to that particular specialist for care.  CSW encouraged client to seek support and assist from her daughter who lives in [place], [place]. [place] encouraged Tina Goodwin to call CSW as  needed to discuss social work needs of client.  Plan: Client to attend scheduled client medical appointments with Dr. Moshe Cipro in next 30 days. CSW to inform RN Janalyn Shy of above information. CSW to call client in three weeks to assess client needs.   Tina Goodwin.Tina Goodwin MSW, LCSW Licensed Clinical Social Worker [facility] Care Management [phone]       Plan:

## 2015-12-10 ENCOUNTER — Telehealth: Payer: Self-pay | Admitting: Gastroenterology

## 2015-12-10 ENCOUNTER — Encounter: Payer: Self-pay | Admitting: Gastroenterology

## 2015-12-10 ENCOUNTER — Ambulatory Visit: Payer: Medicare Other | Admitting: Gastroenterology

## 2015-12-10 NOTE — Telephone Encounter (Signed)
PATIENT WAS A NO SHOW AND LETTER SENT  °

## 2015-12-25 ENCOUNTER — Other Ambulatory Visit: Payer: Self-pay | Admitting: Licensed Clinical Social Worker

## 2015-12-25 NOTE — Patient Outreach (Signed)
Assessment:  CSW called home phone number of client on 12/25/15. CSW spoke via phone with client on 12/25/15. CSW verified client identity. CSW and Tina Goodwin spoke of client needs. Tina Goodwin gave CSW verbal permission on 12/25/15 to speak with her about current needs of client. CSW and client spoke of client care plan.  CSW asked client if she had gone to scheduled appointments with Dr. Moshe Cipro. She said: "I do not go to appointments with Dr. Moshe Cipro until scheduled appointment time."  CSW asked her when her next appointment with Dr. Moshe Cipro was scheduled. She said she did not know when her next scheduled appointment with Dr. Moshe Cipro was scheduled. CSW encouraged client to call Dr. Griffin Dakin office to determine next client appointment with Dr. Moshe Cipro. Client said her daughter and grandson were coming to her home today to visit her. Client said she has her medications and is taking medications as prescribed. She said she is eating adequately and sleeping adequately.  She said she was walking adequately.  She said she only has two teeth left and sometimes it takes her a while to eat her meals.  CSW informed client that RN Tina Goodwin had been trying to call her to discuss client nursing needs. CSW has shared with client previously that RN Tina Goodwin had been trying to call client. Client did not mention any of her cancelled or missed appointments with medical providers. She has missed appointments in recent months with Dr. Moshe Cipro and also with a gastroenterologist in the area.  CSW informed client that [facility] program was available to provide social work, nursing or pharmacy support to client as needed.  CSW encouraged client to call CSW at [phone] to discuss social work needs of client.  Of note, client does not wish to have care providers visit her at her home. She is willing to meet with care providers at her scheduled client medical appointments at office of Dr. Moshe Cipro. However, she has recently either  cancelled or been a no show to these scheduled appointments with Dr. Moshe Cipro.  Plan: Client to attend all scheduled client medical appointments in the next 30 days. CSW to collaborate with RN Tina Goodwin related to monitoring needs of client. CSW to call client in 3 weeks to assess client needs.   Tina Goodwin.Tina Goodwin MSW, LCSW Licensed Clinical Social Worker [facility] Care Management [phone]

## 2016-01-17 ENCOUNTER — Other Ambulatory Visit: Payer: Self-pay | Admitting: Licensed Clinical Social Worker

## 2016-01-17 NOTE — Patient Outreach (Signed)
Assessment:  CSW called client on 01/17/16.  CSW spoke with client via phone on 01/17/16. CSW verified client identity.  Client said she has some family support. She said she plans to schedule dental appointment for herself soon. She has eye appointments scheduled.  She and CSW spoke of her care plan.CSW encouraged client to contact office of Dr. Moshe Cipro to schedule client medical appointment.  Client said she needs to do that. She said she will call office of Dr. Moshe Cipro to check on date and time of her next appointment.  She said she is eating well. She had her basic prescribed medications and is taking medications as prescribed. CSW encouraged client again to attend her scheduled medical appointments.  Client unfortunately has had a history of missing scheduled medical appointments. She has missed appointments on several occasions with Dr. Moshe Cipro. She has missed appointments with gastroenterologist.  CSW thanked client for phone call on 01/17/16.  Plan: Client to attend all scheduled client medical appointments in next 30 days. CSW to call client in 3 weeks to assess client needs  Norva Riffle.Kiernan Atkerson MSW, LCSW Licensed Clinical Social Worker [facility] Care Management [phone]

## 2016-01-21 ENCOUNTER — Other Ambulatory Visit: Payer: Self-pay

## 2016-01-21 DIAGNOSIS — J302 Other seasonal allergic rhinitis: Secondary | ICD-10-CM

## 2016-01-21 MED ORDER — HYDROCHLOROTHIAZIDE 25 MG PO TABS: 25.0000 mg | ORAL_TABLET | Freq: Every morning | ORAL | Status: DC

## 2016-01-21 MED ORDER — NIFEDIPINE ER 30 MG PO TB24: 30.0000 mg | ORAL_TABLET | Freq: Every morning | ORAL | Status: DC

## 2016-01-21 MED ORDER — OMEPRAZOLE 40 MG PO CPDR: 40.0000 mg | DELAYED_RELEASE_CAPSULE | Freq: Every day | ORAL | Status: DC

## 2016-01-21 MED ORDER — MONTELUKAST SODIUM 10 MG PO TABS: 10.0000 mg | ORAL_TABLET | Freq: Every day | ORAL | Status: DC

## 2016-01-21 MED ORDER — ATORVASTATIN CALCIUM 10 MG PO TABS: 10.0000 mg | ORAL_TABLET | Freq: Every day | ORAL | Status: DC

## 2016-01-21 MED ORDER — ENALAPRIL MALEATE 20 MG PO TABS: ORAL_TABLET | ORAL | Status: DC

## 2016-01-24 ENCOUNTER — Other Ambulatory Visit: Payer: Self-pay

## 2016-01-24 MED ORDER — LEVOTHYROXINE SODIUM 50 MCG PO TABS: 50.0000 ug | ORAL_TABLET | Freq: Every day | ORAL | Status: DC

## 2016-01-24 MED ORDER — ACETAMINOPHEN 500 MG PO TABS: 500.0000 mg | ORAL_TABLET | Freq: Four times a day (QID) | ORAL | Status: AC | PRN

## 2016-02-05 ENCOUNTER — Other Ambulatory Visit: Payer: Self-pay | Admitting: Licensed Clinical Social Worker

## 2016-02-05 NOTE — Patient Outreach (Signed)
Assessment: CSW called [facility] on 02/05/16 and spoke with receptionist at that practice. CSW verified that receptionist worked at practice of Dr. Tula Nakayama. Receptionist and CSW spoke of client appointment schedule with Dr. Moshe Cipro.  Receptionist reported to CSW on 02/05/16 that client, Tina Goodwin, had missed the last 4 appointments client had scheduled with Dr. Moshe Cipro. Client has also missed at least one scheduled appointment with a local gastroenterologist. CSW asked receptionist if client had called practice to schedule her next appointment with Dr. Moshe Cipro. Receptionist said that client had not called practice to schedule next appointment with Dr. Moshe Cipro (Client had told CSW on last phone call with CSW that client would call office of Dr. Moshe Cipro to schedule client visit with Dr. Moshe Cipro).  Receptionist said client did not have any scheduled upcoming appointments with Dr. Moshe Cipro. Receptionist said that client was sometimes non compliant and often would not follow through with plans.  RN Janalyn Shy, [facility] nurse, also had difficulty maintaining communication with client for past nursing support. CSW thanked the receptionist at practice of Dr. Moshe Cipro for this information regarding client appointment schedule with Dr. Moshe Cipro.  Plan: CSW to call client on 02/06/16 to discuss [facility] CSW discharge with client.  Tina Goodwin.Tina Goodwin MSW, LCSW Licensed Clinical Social Worker [facility] Care Management [phone]

## 2016-02-06 ENCOUNTER — Other Ambulatory Visit: Payer: Self-pay | Admitting: Licensed Clinical Social Worker

## 2016-02-06 ENCOUNTER — Encounter: Payer: Self-pay | Admitting: Licensed Clinical Social Worker

## 2016-02-06 NOTE — Patient Outreach (Signed)
Assessment:  CSW called client on 02/06/16 and spoke via phone with client. CSW verified client identity. CSW and client spoke of client needs.  Client said she has her prescribed medications and is taking medications as prescribed.  Client has missed the last 4 scheduled client appointments with Dr. Moshe Cipro.  Client also, at this time, does not have a scheduled appointment with Dr. Moshe Cipro. Client promised CSW last month to call Dr. Griffin Dakin office to schedule client appointment. Client has also been a no show for a gastroenterologist appointment in the area.  CSW informed client on 02/06/16 that CSW was discharging client from [facility] on 02/06/16  since client was not working towards her care plan goal and was not attending scheduled client medical appointments with Dr. Moshe Cipro. Client agreed to this plan.  CSW thanked client for phone conversation with CSW on 02/06/16.   Plan: CSW is discharging [place] from [facility] CSW services on 02/06/16 since client is not willing to work towards her care plan goals and is not seeing her primary care doctor with [facility] as scheduled. CSW to inform Josepha Pigg that [place] discharged client on 02/06/16 from [place] services. CSW to fax Dr. Tula Nakayama a physician case closure letter informing Dr. Moshe Cipro that [place] discharged client on 02/06/16 from [facility] CSW services.   Norva Riffle.Chanel Mckesson MSW, LCSW Licensed Clinical Social Worker [facility] Care Management [phone]

## 2016-02-07 ENCOUNTER — Ambulatory Visit: Payer: Self-pay | Admitting: Licensed Clinical Social Worker

## 2016-02-21 ENCOUNTER — Other Ambulatory Visit: Payer: Self-pay

## 2016-02-21 MED ORDER — NIFEDIPINE ER 30 MG PO TB24: 30.0000 mg | ORAL_TABLET | Freq: Every morning | ORAL | Status: DC

## 2016-04-21 ENCOUNTER — Other Ambulatory Visit: Payer: Self-pay

## 2016-04-21 MED ORDER — ENALAPRIL MALEATE 20 MG PO TABS: ORAL_TABLET | ORAL | Status: DC

## 2016-04-21 MED ORDER — HYDROCHLOROTHIAZIDE 25 MG PO TABS: 25.0000 mg | ORAL_TABLET | Freq: Every morning | ORAL | Status: DC

## 2016-04-21 MED ORDER — LEVOTHYROXINE SODIUM 50 MCG PO TABS: 50.0000 ug | ORAL_TABLET | Freq: Every day | ORAL | Status: DC

## 2016-04-21 MED ORDER — OMEPRAZOLE 40 MG PO CPDR: 40.0000 mg | DELAYED_RELEASE_CAPSULE | Freq: Every day | ORAL | Status: DC

## 2016-05-23 ENCOUNTER — Other Ambulatory Visit: Payer: Self-pay | Admitting: Family Medicine

## 2016-05-23 ENCOUNTER — Telehealth: Payer: Self-pay | Admitting: Family Medicine

## 2016-05-23 MED ORDER — HYDROXYZINE HCL 10 MG PO TABS: ORAL_TABLET | ORAL | Status: AC

## 2016-05-23 MED ORDER — NIFEDIPINE ER 30 MG PO TB24: 30.0000 mg | ORAL_TABLET | Freq: Every day | ORAL | Status: DC

## 2016-05-23 NOTE — Telephone Encounter (Signed)
Patient is asking for a refill on NIFEdipine (PROCARDIA-XL/ADALAT CC) 30 MG 24 hr tablet , stating that her skin is itching real bad needs something for it, wrist is swollen and in pain

## 2016-05-23 NOTE — Telephone Encounter (Signed)
Spoke with pt, 1 month medication sent, knows to come in next week for appt date and time given

## 2016-05-27 ENCOUNTER — Ambulatory Visit: Payer: Self-pay | Admitting: Family Medicine

## 2016-05-27 ENCOUNTER — Other Ambulatory Visit: Payer: Self-pay

## 2016-05-27 DIAGNOSIS — M549 Dorsalgia, unspecified: Secondary | ICD-10-CM

## 2016-05-27 MED ORDER — TRAMADOL HCL 50 MG PO TABS: 50.0000 mg | ORAL_TABLET | Freq: Two times a day (BID) | ORAL | Status: DC | PRN

## 2016-05-27 MED ORDER — NIFEDIPINE ER 30 MG PO TB24: 30.0000 mg | ORAL_TABLET | Freq: Every day | ORAL | Status: DC

## 2016-05-29 ENCOUNTER — Telehealth: Payer: Self-pay

## 2016-05-29 MED ORDER — NAPROXEN 375 MG PO TABS: ORAL_TABLET | ORAL | Status: AC

## 2016-05-29 MED ORDER — PREDNISONE 5 MG PO TABS: 5.0000 mg | ORAL_TABLET | Freq: Two times a day (BID) | ORAL | Status: AC

## 2016-05-29 NOTE — Telephone Encounter (Signed)
Patient aware and medications sent to pharmacy with request to deliver.

## 2016-05-29 NOTE — Telephone Encounter (Signed)
Naproxen and prednisone are being substituted and are printed, pls follow through

## 2016-06-11 ENCOUNTER — Telehealth: Payer: Self-pay | Admitting: Family Medicine

## 2016-06-11 NOTE — Telephone Encounter (Signed)
Tina Goodwin called and said she is Tina Goodwin's friend and that [facility] needs an appointment. I told Tina Goodwin that I couldn't talk to her about anyone's medical appointments and that if [name] is a patient of Dr. Moshe Cipro then she has to call her self.

## 2016-09-19 ENCOUNTER — Other Ambulatory Visit: Payer: Self-pay | Admitting: Family Medicine

## 2016-09-19 DIAGNOSIS — M549 Dorsalgia, unspecified: Secondary | ICD-10-CM

## 2016-09-23 ENCOUNTER — Ambulatory Visit: Payer: Self-pay | Admitting: Family Medicine

## 2016-10-10 ENCOUNTER — Other Ambulatory Visit: Payer: Self-pay | Admitting: Family Medicine

## 2016-10-10 DIAGNOSIS — M549 Dorsalgia, unspecified: Secondary | ICD-10-CM

## 2016-10-20 ENCOUNTER — Ambulatory Visit (INDEPENDENT_AMBULATORY_CARE_PROVIDER_SITE_OTHER): Payer: Medicare Other | Admitting: Family Medicine

## 2016-10-20 ENCOUNTER — Encounter: Payer: Self-pay | Admitting: Family Medicine

## 2016-10-20 VITALS — BP 130/58 | HR 56 | Temp 98.3°F | Resp 18 | Ht 67.0 in | Wt 107.1 lb

## 2016-10-20 DIAGNOSIS — I10 Essential (primary) hypertension: Secondary | ICD-10-CM

## 2016-10-20 DIAGNOSIS — M549 Dorsalgia, unspecified: Secondary | ICD-10-CM

## 2016-10-20 DIAGNOSIS — D509 Iron deficiency anemia, unspecified: Secondary | ICD-10-CM

## 2016-10-20 DIAGNOSIS — E559 Vitamin D deficiency, unspecified: Secondary | ICD-10-CM

## 2016-10-20 DIAGNOSIS — E785 Hyperlipidemia, unspecified: Secondary | ICD-10-CM

## 2016-10-20 DIAGNOSIS — J302 Other seasonal allergic rhinitis: Secondary | ICD-10-CM | POA: Diagnosis not present

## 2016-10-20 DIAGNOSIS — E039 Hypothyroidism, unspecified: Secondary | ICD-10-CM

## 2016-10-20 LAB — CBC
HEMATOCRIT: 24.4 % — AB (ref 35.0–45.0)
HEMOGLOBIN: 7 g/dL — AB (ref 11.7–15.5)
MCH: 20.7 pg — ABNORMAL LOW (ref 27.0–33.0)
MCHC: 27.9 g/dL — AB (ref 32.0–36.0)
MCV: 72.2 fL — ABNORMAL LOW (ref 80.0–100.0)
MPV: 8.7 fL (ref 7.5–12.5)
Platelets: 404 10*3/uL — ABNORMAL HIGH (ref 140–400)
RBC: 3.38 MIL/uL — AB (ref 3.80–5.10)
RDW: 17 % — ABNORMAL HIGH (ref 11.0–15.0)
WBC: 4.5 10*3/uL (ref 3.8–10.8)

## 2016-10-20 LAB — COMPREHENSIVE METABOLIC PANEL
ALT: 10 U/L (ref 6–29)
AST: 19 U/L (ref 10–35)
Albumin: 3.9 g/dL (ref 3.6–5.1)
Alkaline Phosphatase: 155 U/L — ABNORMAL HIGH (ref 33–130)
BUN: 14 mg/dL (ref 7–25)
CHLORIDE: 105 mmol/L (ref 98–110)
CO2: 27 mmol/L (ref 20–31)
Calcium: 10.3 mg/dL (ref 8.6–10.4)
Creat: 0.99 mg/dL — ABNORMAL HIGH (ref 0.60–0.88)
GLUCOSE: 85 mg/dL (ref 65–99)
POTASSIUM: 4.2 mmol/L (ref 3.5–5.3)
Sodium: 139 mmol/L (ref 135–146)
Total Bilirubin: 0.4 mg/dL (ref 0.2–1.2)
Total Protein: 7.1 g/dL (ref 6.1–8.1)

## 2016-10-20 LAB — LIPID PANEL
CHOL/HDL RATIO: 3.9 ratio (ref <5.0)
Cholesterol: 153 mg/dL (ref <200)
HDL: 39 mg/dL — ABNORMAL LOW (ref >50)
LDL CALC: 85 mg/dL (ref <100)
TRIGLYCERIDES: 146 mg/dL (ref <150)
VLDL: 29 mg/dL (ref <30)

## 2016-10-20 LAB — FERRITIN: FERRITIN: 6 ng/mL — AB (ref 20–288)

## 2016-10-20 LAB — TSH: TSH: 0.18 mIU/L — ABNORMAL LOW

## 2016-10-20 LAB — IRON: Iron: 16 ug/dL — ABNORMAL LOW (ref 45–160)

## 2016-10-20 MED ORDER — NIFEDIPINE ER 30 MG PO TB24: 30.0000 mg | ORAL_TABLET | Freq: Every day | ORAL | 1 refills | Status: DC

## 2016-10-20 MED ORDER — HYDROCHLOROTHIAZIDE 25 MG PO TABS: 25.0000 mg | ORAL_TABLET | Freq: Every morning | ORAL | 1 refills | Status: DC

## 2016-10-20 MED ORDER — ENALAPRIL MALEATE 20 MG PO TABS: ORAL_TABLET | ORAL | 1 refills | Status: DC

## 2016-10-20 MED ORDER — LEVOTHYROXINE SODIUM 50 MCG PO TABS: 50.0000 ug | ORAL_TABLET | Freq: Every day | ORAL | 1 refills | Status: DC

## 2016-10-20 MED ORDER — ATORVASTATIN CALCIUM 10 MG PO TABS: 10.0000 mg | ORAL_TABLET | Freq: Every day | ORAL | 1 refills | Status: DC

## 2016-10-20 MED ORDER — MONTELUKAST SODIUM 10 MG PO TABS: 10.0000 mg | ORAL_TABLET | Freq: Every day | ORAL | 1 refills | Status: AC

## 2016-10-20 MED ORDER — TRAMADOL HCL 50 MG PO TABS: 50.0000 mg | ORAL_TABLET | Freq: Two times a day (BID) | ORAL | 2 refills | Status: DC | PRN

## 2016-10-20 MED ORDER — OMEPRAZOLE 40 MG PO CPDR: 40.0000 mg | DELAYED_RELEASE_CAPSULE | Freq: Every day | ORAL | 1 refills | Status: DC

## 2016-10-20 MED ORDER — EZETIMIBE 10 MG PO TABS
10.0000 mg | ORAL_TABLET | Freq: Every day | ORAL | 1 refills | Status: DC
Start: 2016-10-20 — End: 2017-01-28

## 2016-10-20 NOTE — Patient Instructions (Signed)
Annual physical exam in 5 month, call igf you need me before  Labs today CBc, lipid, cmp, TSH, and Vit D today

## 2016-10-20 NOTE — Assessment & Plan Note (Signed)
Chronic and unchanged, no change in management 

## 2016-10-20 NOTE — Assessment & Plan Note (Signed)
Weight loss and iDA recommending GI eval, will need to verify she will go before referral made

## 2016-10-20 NOTE — Assessment & Plan Note (Signed)
Deteriorated, need GI eval

## 2016-10-20 NOTE — Progress Notes (Signed)
   Tina Goodwin     MRN: [id number]      DOB: Nov 26, 1931   HPI Tina Goodwin is here for follow up and re-evaluation of chronic medical conditions, medication management and review of any available recent lab and radiology data.  Preventive health is updated, specifically  Cancer screening and Immunization.   Refuses exam which is overdue, has not been in for 1 year. Now states that she "needs help" but unable to specify what help she will accept. States if she went into assisted living she "would die" States she does see her daughter from time to time, daughter lives out of town The PT denies any adverse reaction to her medications. c/o "pain all over" and feels cold all the time ROS  Denies sinus pressure, nasal congestion, ear pain or sore throat. Denies chest congestion, productive cough or wheezing. Denies chest pains, palpitations and leg swelling Denies abdominal pain, nausea, vomiting,diarrhea or constipation.   Denies dysuria, frequency, hesitancy or incontinence. Denies headaches, seizures, numbness, or tingling. Denies depression, anxiety or insomnia. Denies skin break down or rash.   PE  BP (!) 130/58 (BP Location: Left Arm, Patient Position: Sitting, Cuff Size: Normal)   Pulse (!) 56   Temp 98.3 F (36.8 C) (Oral)   Resp 18   Ht 5\' 7"  (1.702 m)   Wt 107 lb 1.9 oz (48.6 kg)   SpO2 98%   BMI 16.78 kg/m   Patient alert , malnourishend in no cardiopulmonary distress.bitemporal wasting, mucosa pale  HEENT: No facial asymmetry, EOMI,   oropharynx pink and moist.  Neck decreased ROM, no JVD, no mass.  Chest: Clear to auscultation bilaterally.  CVS: S1, S2 no murmurs, no S3.Regular rate.  ABD: Soft non tender.   Ext: No edema  MS: decreased ROM spine, shoulders, hips and knees.  Skin: Intact, no ulcerations or rash noted.  Psych: Good eye contact, normal affect. Memory intact not anxious or depressed appearing.  CNS: CN 2-12 intact, power,  normal  throughout.no focal deficits noted.   Assessment & Plan  Hyperlipidemia Hyperlipidemia:Low fat diet discussed and encouraged.   Lipid Panel  Lab Results  Component Value Date   CHOL 153 10/20/2016   HDL 39 (L) 10/20/2016   LDLCALC 85 10/20/2016   TRIG 146 10/20/2016   CHOLHDL 3.9 10/20/2016   Controlled, no change in medication     Hypothyroidism Over corrected need to reduce dose of medication and rept tSH in 8 weeks  FATIGUE Weight loss and iDA recommending GI eval, will need to verify she will go before referral made  Back pain with radiation Chronic and unchanged, no change in management  Iron deficiency anemia Deteriorated, need GI eval

## 2016-10-20 NOTE — Assessment & Plan Note (Signed)
Hyperlipidemia:Low fat diet discussed and encouraged.   Lipid Panel  Lab Results  Component Value Date   CHOL 153 10/20/2016   HDL 39 (L) 10/20/2016   LDLCALC 85 10/20/2016   TRIG 146 10/20/2016   CHOLHDL 3.9 10/20/2016   Controlled, no change in medication

## 2016-10-20 NOTE — Assessment & Plan Note (Signed)
Over corrected need to reduce dose of medication and rept tSH in 8 weeks

## 2016-10-21 ENCOUNTER — Telehealth: Payer: Self-pay | Admitting: Family Medicine

## 2016-10-21 ENCOUNTER — Other Ambulatory Visit: Payer: Self-pay | Admitting: Family Medicine

## 2016-10-21 LAB — VITAMIN D 25 HYDROXY (VIT D DEFICIENCY, FRACTURES): Vit D, 25-Hydroxy: 13 ng/mL — ABNORMAL LOW (ref 30–100)

## 2016-10-21 MED ORDER — ERGOCALCIFEROL 1.25 MG (50000 UT) PO CAPS: 50000.0000 [IU] | ORAL_CAPSULE | ORAL | 1 refills | Status: DC

## 2016-10-21 NOTE — Telephone Encounter (Signed)
Patient addressed by pcp.  Spoke with short stay and they do not have any openings for today for transfusion.

## 2016-10-21 NOTE — Telephone Encounter (Signed)
Direct call made to patient at mid day, she again is refusing to go for blood transfusion needed, "waiting on this" for a message from her "heavenly Father" I explained she NEEDS blood so that she does not feel as weak as she is, I advised her to call back and let us know once she decided to be transfused I recommended that she go directly to ED for admission if she became increasingly  Weak. I asked repeatedly for contact info and permission to speak with her daughter in [place], she refused , stating daughter is working nd she does not have time . No transfusion is available before Thursday and pt will need to be typed and crossed today, I believe with her circumstance and not cooperating she will likely end up needing admission, both for transfusion and to determine the cause of her anemia, called back to explain to her the need for type and cross today for earliest transfusion on Thursday or probable admission, hung up phone stating will not go to [facility] as "they nearly killed her" I asked her to let me know where she would want to go and when and at that time she had the phone off the hook and would not speak Needs to be aware of change in synthroid med and also the need to take the weekly vit D, she is aware

## 2016-10-21 NOTE — Telephone Encounter (Signed)
Direct contact made with patient , states has to calm down, no transport to get transfusion, does not want to go today, nervous and anxious, needs to calm down , does not want daughter in [place] contacted, states unable to help her I asked that she call back by 11:30 am and I will send in request for 2 units pRBC to be transfused pls send over request , hopefully can arrange for am transfusion,

## 2016-10-21 NOTE — Telephone Encounter (Signed)
pls directly contact pharmacy re reduc ed dose of synthroid which is entered, I tried to exp[lain to the pt, pls then print and fax, half tab 4 days weekly, weekly vit D has been sent in  thanks

## 2016-10-21 NOTE — Telephone Encounter (Signed)
Aware on situation

## 2016-10-21 NOTE — Telephone Encounter (Signed)
[facility] called with critical value.. 7.0 hemoglobin.

## 2016-10-27 ENCOUNTER — Other Ambulatory Visit: Payer: Self-pay

## 2016-10-27 MED ORDER — LEVOTHYROXINE SODIUM 50 MCG PO TABS: ORAL_TABLET | ORAL | 4 refills | Status: DC

## 2016-10-27 NOTE — Telephone Encounter (Signed)
Medication adjustment addressed with pharmacy.

## 2016-11-19 ENCOUNTER — Telehealth: Payer: Self-pay

## 2016-11-19 NOTE — Telephone Encounter (Signed)
Vomiting No.    Recommended treatment Hydration is important Fluids small frequent amounts as tolerated Good hygiene reduces transmission among family members Review Brat diet  Zofran 4 mg 1 tablet daily as needed for nausea and vomiting no more than 6 tablets   DiarrheaYes.    Recommended treatment  Imodium OTC  Can also offer Lomotil 1 tablet 4 times daily as needed no more than 10 tablets Good hygiene reduces transmission among family members Review Brat Diet  If patient starts to feel light headed or not passing much urine or becoming dehydrated will need to go to emergency room for IV hydration  Please call office if symptoms worsen or do not improve after 2-3 days    Patient c/o diarrhea when called by care guide for [facility].   Patient states that she has loose bowels after eating.  Also c/o fatigue.  Standing order initiated for Lomotil.   Rx called in to [place] and they will deliver.  Patient aware of pharmacy accommodations.

## 2016-12-08 ENCOUNTER — Telehealth: Payer: Self-pay | Admitting: Family Medicine

## 2016-12-08 DIAGNOSIS — M549 Dorsalgia, unspecified: Secondary | ICD-10-CM

## 2016-12-08 NOTE — Telephone Encounter (Signed)
[place] is calling stating that she is hurting and she is asking for pain medication she states she cant live without them, please advise?

## 2016-12-09 MED ORDER — TRAMADOL HCL 50 MG PO TABS: 50.0000 mg | ORAL_TABLET | Freq: Two times a day (BID) | ORAL | 2 refills | Status: AC | PRN

## 2016-12-09 NOTE — Telephone Encounter (Signed)
Tramadol refilled to [place] with request to deliver

## 2017-01-28 ENCOUNTER — Other Ambulatory Visit: Payer: Self-pay

## 2017-01-28 ENCOUNTER — Other Ambulatory Visit: Payer: Self-pay | Admitting: Family Medicine

## 2017-01-28 ENCOUNTER — Telehealth: Payer: Self-pay | Admitting: Family Medicine

## 2017-01-28 MED ORDER — ERGOCALCIFEROL 1.25 MG (50000 UT) PO CAPS: 50000.0000 [IU] | ORAL_CAPSULE | ORAL | 1 refills | Status: AC

## 2017-01-28 MED ORDER — HYDROCHLOROTHIAZIDE 25 MG PO TABS: 25.0000 mg | ORAL_TABLET | Freq: Every morning | ORAL | 1 refills | Status: DC

## 2017-01-28 MED ORDER — ENALAPRIL MALEATE 20 MG PO TABS: ORAL_TABLET | ORAL | 1 refills | Status: DC

## 2017-01-28 MED ORDER — NIFEDIPINE ER 30 MG PO TB24
30.0000 mg | ORAL_TABLET | Freq: Every day | ORAL | 1 refills | Status: DC
Start: 2017-01-28 — End: 2017-05-01

## 2017-01-28 MED ORDER — ATORVASTATIN CALCIUM 10 MG PO TABS: 10.0000 mg | ORAL_TABLET | Freq: Every day | ORAL | 1 refills | Status: AC

## 2017-01-28 MED ORDER — EZETIMIBE 10 MG PO TABS: 10.0000 mg | ORAL_TABLET | Freq: Every day | ORAL | 1 refills | Status: AC

## 2017-01-28 MED ORDER — LEVOTHYROXINE SODIUM 50 MCG PO TABS: ORAL_TABLET | ORAL | 4 refills | Status: AC

## 2017-01-28 MED ORDER — OMEPRAZOLE 40 MG PO CPDR: 40.0000 mg | DELAYED_RELEASE_CAPSULE | Freq: Every day | ORAL | 1 refills | Status: DC

## 2017-01-28 NOTE — Telephone Encounter (Signed)
Tina Goodwin is asking for medication refills please advise?

## 2017-01-28 NOTE — Telephone Encounter (Signed)
meds refilled 

## 2017-03-17 ENCOUNTER — Encounter: Payer: Self-pay | Admitting: Family Medicine

## 2017-05-01 ENCOUNTER — Telehealth: Payer: Self-pay | Admitting: *Deleted

## 2017-05-01 ENCOUNTER — Other Ambulatory Visit: Payer: Self-pay

## 2017-05-01 MED ORDER — OMEPRAZOLE 40 MG PO CPDR: 40.0000 mg | DELAYED_RELEASE_CAPSULE | Freq: Every day | ORAL | 0 refills | Status: AC

## 2017-05-01 MED ORDER — NIFEDIPINE ER 30 MG PO TB24: 30.0000 mg | ORAL_TABLET | Freq: Every day | ORAL | 0 refills | Status: AC

## 2017-05-01 MED ORDER — HYDROCHLOROTHIAZIDE 25 MG PO TABS: 25.0000 mg | ORAL_TABLET | Freq: Every morning | ORAL | 0 refills | Status: AC

## 2017-05-01 MED ORDER — ENALAPRIL MALEATE 20 MG PO TABS: ORAL_TABLET | ORAL | 0 refills | Status: AC

## 2017-05-01 NOTE — Telephone Encounter (Signed)
Refill sent with msg that appt was needed before further fills

## 2017-05-01 NOTE — Telephone Encounter (Signed)
Patient called requesting a refill on enalapril  20mg , omeprazole 40mg  90 pills, hydrochlorothiazide 25mg , and blood pressure medicine

## 2017-05-25 ENCOUNTER — Ambulatory Visit: Payer: Self-pay

## 2017-09-18 ENCOUNTER — Emergency Department (HOSPITAL_COMMUNITY): Payer: Medicare Other

## 2017-09-18 ENCOUNTER — Encounter (HOSPITAL_COMMUNITY): Payer: Self-pay | Admitting: Emergency Medicine

## 2017-09-18 DIAGNOSIS — R748 Abnormal levels of other serum enzymes: Secondary | ICD-10-CM

## 2017-09-18 DIAGNOSIS — R404 Transient alteration of awareness: Secondary | ICD-10-CM | POA: Diagnosis not present

## 2017-09-18 DIAGNOSIS — E785 Hyperlipidemia, unspecified: Secondary | ICD-10-CM | POA: Diagnosis not present

## 2017-09-18 DIAGNOSIS — I130 Hypertensive heart and chronic kidney disease with heart failure and stage 1 through stage 4 chronic kidney disease, or unspecified chronic kidney disease: Secondary | ICD-10-CM | POA: Diagnosis not present

## 2017-09-18 DIAGNOSIS — I824Z2 Acute embolism and thrombosis of unspecified deep veins of left distal lower extremity: Secondary | ICD-10-CM | POA: Diagnosis present

## 2017-09-18 DIAGNOSIS — F1010 Alcohol abuse, uncomplicated: Secondary | ICD-10-CM | POA: Diagnosis present

## 2017-09-18 DIAGNOSIS — N183 Chronic kidney disease, stage 3 (moderate): Secondary | ICD-10-CM | POA: Diagnosis present

## 2017-09-18 DIAGNOSIS — I959 Hypotension, unspecified: Secondary | ICD-10-CM | POA: Diagnosis not present

## 2017-09-18 DIAGNOSIS — M79672 Pain in left foot: Secondary | ICD-10-CM | POA: Diagnosis not present

## 2017-09-18 DIAGNOSIS — N179 Acute kidney failure, unspecified: Secondary | ICD-10-CM

## 2017-09-18 DIAGNOSIS — Z66 Do not resuscitate: Secondary | ICD-10-CM | POA: Diagnosis not present

## 2017-09-18 DIAGNOSIS — J90 Pleural effusion, not elsewhere classified: Secondary | ICD-10-CM | POA: Diagnosis not present

## 2017-09-18 DIAGNOSIS — I82412 Acute embolism and thrombosis of left femoral vein: Secondary | ICD-10-CM | POA: Diagnosis not present

## 2017-09-18 DIAGNOSIS — J9601 Acute respiratory failure with hypoxia: Secondary | ICD-10-CM

## 2017-09-18 DIAGNOSIS — R680 Hypothermia, not associated with low environmental temperature: Secondary | ICD-10-CM | POA: Diagnosis present

## 2017-09-18 DIAGNOSIS — J449 Chronic obstructive pulmonary disease, unspecified: Secondary | ICD-10-CM | POA: Diagnosis not present

## 2017-09-18 DIAGNOSIS — Z515 Encounter for palliative care: Secondary | ICD-10-CM

## 2017-09-18 DIAGNOSIS — Z79890 Hormone replacement therapy (postmenopausal): Secondary | ICD-10-CM

## 2017-09-18 DIAGNOSIS — D649 Anemia, unspecified: Secondary | ICD-10-CM

## 2017-09-18 DIAGNOSIS — Z681 Body mass index (BMI) 19 or less, adult: Secondary | ICD-10-CM

## 2017-09-18 DIAGNOSIS — D509 Iron deficiency anemia, unspecified: Secondary | ICD-10-CM | POA: Diagnosis not present

## 2017-09-18 DIAGNOSIS — J9690 Respiratory failure, unspecified, unspecified whether with hypoxia or hypercapnia: Secondary | ICD-10-CM | POA: Diagnosis not present

## 2017-09-18 DIAGNOSIS — Z9842 Cataract extraction status, left eye: Secondary | ICD-10-CM

## 2017-09-18 DIAGNOSIS — Z8249 Family history of ischemic heart disease and other diseases of the circulatory system: Secondary | ICD-10-CM

## 2017-09-18 DIAGNOSIS — Z9841 Cataract extraction status, right eye: Secondary | ICD-10-CM

## 2017-09-18 DIAGNOSIS — I82432 Acute embolism and thrombosis of left popliteal vein: Secondary | ICD-10-CM | POA: Diagnosis not present

## 2017-09-18 DIAGNOSIS — Z85850 Personal history of malignant neoplasm of thyroid: Secondary | ICD-10-CM

## 2017-09-18 DIAGNOSIS — I5021 Acute systolic (congestive) heart failure: Secondary | ICD-10-CM | POA: Diagnosis present

## 2017-09-18 DIAGNOSIS — E43 Unspecified severe protein-calorie malnutrition: Secondary | ICD-10-CM | POA: Diagnosis not present

## 2017-09-18 DIAGNOSIS — K8020 Calculus of gallbladder without cholecystitis without obstruction: Secondary | ICD-10-CM | POA: Diagnosis not present

## 2017-09-18 DIAGNOSIS — K7581 Nonalcoholic steatohepatitis (NASH): Secondary | ICD-10-CM | POA: Diagnosis present

## 2017-09-18 DIAGNOSIS — G9340 Encephalopathy, unspecified: Secondary | ICD-10-CM | POA: Diagnosis present

## 2017-09-18 DIAGNOSIS — Z87891 Personal history of nicotine dependence: Secondary | ICD-10-CM

## 2017-09-18 DIAGNOSIS — E890 Postprocedural hypothyroidism: Secondary | ICD-10-CM | POA: Diagnosis present

## 2017-09-18 DIAGNOSIS — R531 Weakness: Secondary | ICD-10-CM

## 2017-09-18 DIAGNOSIS — D72829 Elevated white blood cell count, unspecified: Secondary | ICD-10-CM | POA: Diagnosis not present

## 2017-09-18 DIAGNOSIS — I82402 Acute embolism and thrombosis of unspecified deep veins of left lower extremity: Secondary | ICD-10-CM | POA: Diagnosis not present

## 2017-09-18 DIAGNOSIS — I4891 Unspecified atrial fibrillation: Secondary | ICD-10-CM | POA: Diagnosis not present

## 2017-09-18 DIAGNOSIS — I214 Non-ST elevation (NSTEMI) myocardial infarction: Principal | ICD-10-CM

## 2017-09-18 DIAGNOSIS — J9600 Acute respiratory failure, unspecified whether with hypoxia or hypercapnia: Secondary | ICD-10-CM

## 2017-09-18 DIAGNOSIS — I2510 Atherosclerotic heart disease of native coronary artery without angina pectoris: Secondary | ICD-10-CM | POA: Diagnosis not present

## 2017-09-18 DIAGNOSIS — M7989 Other specified soft tissue disorders: Secondary | ICD-10-CM | POA: Diagnosis not present

## 2017-09-18 DIAGNOSIS — E1122 Type 2 diabetes mellitus with diabetic chronic kidney disease: Secondary | ICD-10-CM | POA: Diagnosis present

## 2017-09-18 DIAGNOSIS — I340 Nonrheumatic mitral (valve) insufficiency: Secondary | ICD-10-CM | POA: Diagnosis not present

## 2017-09-18 DIAGNOSIS — R4020 Unspecified coma: Secondary | ICD-10-CM | POA: Diagnosis not present

## 2017-09-18 LAB — COMPREHENSIVE METABOLIC PANEL WITH GFR
ALT: 77 U/L — ABNORMAL HIGH (ref 14–54)
AST: 71 U/L — ABNORMAL HIGH (ref 15–41)
Albumin: 3.1 g/dL — ABNORMAL LOW (ref 3.5–5.0)
Alkaline Phosphatase: 201 U/L — ABNORMAL HIGH (ref 38–126)
Anion gap: 14 (ref 5–15)
BUN: 77 mg/dL — ABNORMAL HIGH (ref 6–20)
CO2: 18 mmol/L — ABNORMAL LOW (ref 22–32)
Calcium: 9.5 mg/dL (ref 8.9–10.3)
Chloride: 111 mmol/L (ref 101–111)
Creatinine, Ser: 1.83 mg/dL — ABNORMAL HIGH (ref 0.44–1.00)
GFR calc Af Amer: 28 mL/min — ABNORMAL LOW
GFR calc non Af Amer: 24 mL/min — ABNORMAL LOW
Glucose, Bld: 111 mg/dL — ABNORMAL HIGH (ref 65–99)
Potassium: 4.5 mmol/L (ref 3.5–5.1)
Sodium: 143 mmol/L (ref 135–145)
Total Bilirubin: 1.5 mg/dL — ABNORMAL HIGH (ref 0.3–1.2)
Total Protein: 5.9 g/dL — ABNORMAL LOW (ref 6.5–8.1)

## 2017-09-18 LAB — CBC WITH DIFFERENTIAL/PLATELET
Basophils Absolute: 0 K/uL (ref 0.0–0.1)
Basophils Relative: 0 %
Eosinophils Absolute: 0 K/uL (ref 0.0–0.7)
Eosinophils Relative: 0 %
HCT: 29.5 % — ABNORMAL LOW (ref 36.0–46.0)
Hemoglobin: 8.1 g/dL — ABNORMAL LOW (ref 12.0–15.0)
Lymphocytes Relative: 3 %
Lymphs Abs: 0.4 K/uL — ABNORMAL LOW (ref 0.7–4.0)
MCH: 19.7 pg — ABNORMAL LOW (ref 26.0–34.0)
MCHC: 27.5 g/dL — ABNORMAL LOW (ref 30.0–36.0)
MCV: 71.8 fL — ABNORMAL LOW (ref 78.0–100.0)
Monocytes Absolute: 0.6 K/uL (ref 0.1–1.0)
Monocytes Relative: 4 %
Neutro Abs: 11.5 K/uL — ABNORMAL HIGH (ref 1.7–7.7)
Neutrophils Relative %: 93 %
Platelets: 200 K/uL (ref 150–400)
RBC: 4.11 MIL/uL (ref 3.87–5.11)
RDW: 18.4 % — ABNORMAL HIGH (ref 11.5–15.5)
WBC: 12.4 K/uL — ABNORMAL HIGH (ref 4.0–10.5)

## 2017-09-18 LAB — AMMONIA: Ammonia: 13 umol/L (ref 9–35)

## 2017-09-18 LAB — URINALYSIS, ROUTINE W REFLEX MICROSCOPIC
BILIRUBIN URINE: NEGATIVE
GLUCOSE, UA: NEGATIVE mg/dL
HGB URINE DIPSTICK: NEGATIVE
Ketones, ur: NEGATIVE mg/dL
Nitrite: NEGATIVE
PH: 5 (ref 5.0–8.0)
Protein, ur: NEGATIVE mg/dL
SPECIFIC GRAVITY, URINE: 1.016 (ref 1.005–1.030)

## 2017-09-18 LAB — APTT: aPTT: 32 s (ref 24–36)

## 2017-09-18 LAB — RAPID URINE DRUG SCREEN, HOSP PERFORMED
AMPHETAMINES: NOT DETECTED
BARBITURATES: NOT DETECTED
BENZODIAZEPINES: NOT DETECTED
COCAINE: NOT DETECTED
Opiates: NOT DETECTED
TETRAHYDROCANNABINOL: NOT DETECTED

## 2017-09-18 LAB — TROPONIN I: Troponin I: 4.9 ng/mL

## 2017-09-18 LAB — ETHANOL: Alcohol, Ethyl (B): <10 mg/dL

## 2017-09-18 LAB — ABO/RH: ABO/RH(D): O POS

## 2017-09-18 LAB — PROTIME-INR
INR: 1.99
PROTHROMBIN TIME: 22.4 s — AB (ref 11.4–15.2)

## 2017-09-18 LAB — BRAIN NATRIURETIC PEPTIDE: B Natriuretic Peptide: 1061 pg/mL — ABNORMAL HIGH (ref 0.0–100.0)

## 2017-09-18 LAB — T4, FREE: Free T4: 1.03 ng/dL (ref 0.61–1.12)

## 2017-09-18 LAB — I-STAT CG4 LACTIC ACID, ED: Lactic Acid, Venous: 2.68 mmol/L (ref 0.5–1.9)

## 2017-09-18 LAB — CK: Total CK: 111 U/L (ref 38–234)

## 2017-09-18 LAB — LIPASE, BLOOD: Lipase: 24 U/L (ref 11–51)

## 2017-09-18 LAB — PREPARE RBC (CROSSMATCH)

## 2017-09-18 LAB — TSH: TSH: 1.893 u[IU]/mL (ref 0.350–4.500)

## 2017-09-18 MED ORDER — SODIUM CHLORIDE 0.9 % IV SOLN
10.0000 mL/h | Freq: Once | INTRAVENOUS | Status: AC
  Administered 2017-09-18: 10 mL/h via INTRAVENOUS

## 2017-09-18 MED ORDER — ACETAMINOPHEN 650 MG RE SUPP: 650.0000 mg | Freq: Four times a day (QID) | RECTAL | Status: DC | PRN

## 2017-09-18 MED ORDER — SODIUM CHLORIDE 0.9 % IV BOLUS (SEPSIS)
500.0000 mL | Freq: Once | INTRAVENOUS | Status: AC
  Administered 2017-09-18: 500 mL via INTRAVENOUS

## 2017-09-18 MED ORDER — LEVOTHYROXINE SODIUM 50 MCG PO TABS
50.0000 ug | ORAL_TABLET | Freq: Every day | ORAL | Status: DC
  Administered 2017-09-19: 50 ug via ORAL
  Filled 2017-09-18: qty 1

## 2017-09-18 MED ORDER — ONDANSETRON HCL 4 MG/2ML IJ SOLN: 4.0000 mg | Freq: Four times a day (QID) | INTRAMUSCULAR | Status: DC | PRN

## 2017-09-18 MED ORDER — EZETIMIBE 10 MG PO TABS
10.0000 mg | ORAL_TABLET | Freq: Every day | ORAL | Status: DC
  Administered 2017-09-19: 10 mg via ORAL
  Filled 2017-09-18 (×2): qty 1

## 2017-09-18 MED ORDER — SODIUM CHLORIDE 0.9 % IV SOLN
250.0000 mL | INTRAVENOUS | Status: DC | PRN
Start: 2017-09-18 — End: 2017-09-22

## 2017-09-18 MED ORDER — HEPARIN BOLUS VIA INFUSION
4000.0000 [IU] | Freq: Once | INTRAVENOUS | Status: AC
  Administered 2017-09-18: 4000 [IU] via INTRAVENOUS

## 2017-09-18 MED ORDER — SODIUM CHLORIDE 0.9% FLUSH
3.0000 mL | INTRAVENOUS | Status: DC | PRN
Start: 2017-09-18 — End: 2017-09-22

## 2017-09-18 MED ORDER — SODIUM CHLORIDE 0.9 % IV SOLN
INTRAVENOUS | Status: DC
  Administered 2017-09-18: 75 mL/h via INTRAVENOUS
  Administered 2017-09-19 – 2017-09-20 (×2): via INTRAVENOUS

## 2017-09-18 MED ORDER — HEPARIN (PORCINE) IN NACL 100-0.45 UNIT/ML-% IJ SOLN
1200.0000 [IU]/h | INTRAMUSCULAR | Status: DC
  Administered 2017-09-19: 1150 [IU]/h via INTRAVENOUS
  Administered 2017-09-20: 1200 [IU]/h via INTRAVENOUS
  Filled 2017-09-18 (×2): qty 250

## 2017-09-18 MED ORDER — ONDANSETRON HCL 4 MG PO TABS: 4.0000 mg | ORAL_TABLET | Freq: Four times a day (QID) | ORAL | Status: DC | PRN

## 2017-09-18 MED ORDER — AMIODARONE LOAD VIA INFUSION
150.0000 mg | Freq: Once | INTRAVENOUS | Status: AC
  Administered 2017-09-18: 150 mg via INTRAVENOUS
  Filled 2017-09-18: qty 83.34

## 2017-09-18 MED ORDER — ACETAMINOPHEN 325 MG PO TABS: 650.0000 mg | ORAL_TABLET | Freq: Four times a day (QID) | ORAL | Status: DC | PRN

## 2017-09-18 MED ORDER — HEPARIN (PORCINE) IN NACL 100-0.45 UNIT/ML-% IJ SOLN
16.0000 [IU]/kg/h | INTRAMUSCULAR | Status: DC
  Administered 2017-09-18: 16 [IU]/kg/h via INTRAVENOUS
  Filled 2017-09-18: qty 250

## 2017-09-18 MED ORDER — TOBRAMYCIN 0.3 % OP SOLN: 2.0000 [drp] | OPHTHALMIC | Status: DC

## 2017-09-18 MED ORDER — ORAL CARE MOUTH RINSE
15.0000 mL | Freq: Two times a day (BID) | OROMUCOSAL | Status: DC
  Administered 2017-09-19 – 2017-09-21 (×4): 15 mL via OROMUCOSAL

## 2017-09-18 MED ORDER — SODIUM CHLORIDE 0.9% FLUSH
3.0000 mL | Freq: Two times a day (BID) | INTRAVENOUS | Status: DC
  Administered 2017-09-20 – 2017-09-21 (×2): 3 mL via INTRAVENOUS

## 2017-09-18 MED ORDER — AMIODARONE HCL IN DEXTROSE 360-4.14 MG/200ML-% IV SOLN
60.0000 mg/h | INTRAVENOUS | Status: AC
  Administered 2017-09-18 (×2): 60 mg/h via INTRAVENOUS
  Filled 2017-09-18 (×2): qty 200

## 2017-09-18 MED ORDER — ATORVASTATIN CALCIUM 10 MG PO TABS
10.0000 mg | ORAL_TABLET | Freq: Every day | ORAL | Status: DC
  Administered 2017-09-19: 10 mg via ORAL
  Filled 2017-09-18: qty 1

## 2017-09-18 MED ORDER — AMIODARONE HCL IN DEXTROSE 360-4.14 MG/200ML-% IV SOLN
30.0000 mg/h | INTRAVENOUS | Status: DC
  Administered 2017-09-18 – 2017-09-21 (×3): 30 mg/h via INTRAVENOUS
  Filled 2017-09-18 (×4): qty 200

## 2017-09-18 NOTE — H&P (Signed)
History and Physical    Tina Goodwin IEP:[id number] DOB: 02-24-1932 DOA: 10/08/2017  Referring MD/NP/PA: Julianne Rice, EDP  PCP: Fayrene Helper, MD  Patient coming from: Home  Chief Complaint: Found down  HPI: Tina Goodwin is a 83 y.o. female she is unable to give any history of present, per chart has a history of hypertension, diabetes, COPD, chronic anemia.  She is admitted with acute encephalopathy.  It appears that the mailman had noticed that she had not been picking up her mail for a few days and called the police.  She was found lying on the couch confused in a puddle of urine and brought into the emergency department.  She is not able to give any history, she is alert and awake but confused.  She states that she hurts all over, cannot specify any location, she does say that she feels weak and particularly mentions pain of her left foot.  In the ED she was noticed to have many issues including hypothermia, anemia with a hemoglobin of 8.1 acute renal failure with a creatinine of 1.83, a lactic acid of 2.68, a troponin of 4.9, she was found to be in atrial fibrillation with RVR and a heart rate of 130s-140s.  Left lower extremity ultrasound also shows evidence for DVT involving the left common femoral, profunda femoral, superficial femoral, popliteal and calf veins.  CT scan of the head without acute findings.  CT abdomen and pelvis shows bilateral pleural effusions, mesenteric edema, diffuse anasarca without other acute intra-abdominal or pelvic process identified.  Admission requested  Past Medical/Surgical History: Past Medical History:  Diagnosis Date  . Allergic rhinitis   . Anemia, iron deficiency   . Cholecystitis chronic   . COPD (chronic obstructive pulmonary disease) ([place])   . Depression   . Dysuria   . Hyperlipidemia   . Hypothyroidism   . NASH (nonalcoholic steatohepatitis)   . Numbness of foot    Bilateral   . Renal mass   . Right forearm fracture   .  Thyroid cancer ([place])   . Uncontrolled stage 2 hypertension     Past Surgical History:  Procedure Laterality Date  . BACK SURGERY     x2  . EUS N/A 06/15/2014   Procedure: UPPER ENDOSCOPIC ULTRASOUND (EUS) LINEAR;  Surgeon: Milus Banister, MD;  Location: [facility];  Service: Endoscopy;  Laterality: N/A;  . EYE SURGERY Left    [facility]  . EYE SURGERY Right   . Left cataract extraction    . Right cataract extraction    . SPINE SURGERY     two times  . THYROIDECTOMY    . VESICOVAGINAL FISTULA CLOSURE W/ TAH      Social History:  reports that she quit smoking about 16 years ago. Her smoking use included Cigarettes. She has never used smokeless tobacco. She reports that she does not drink alcohol or use drugs.  Allergies: No Known Allergies  Family History:  Family History  Problem Relation Age of Onset  . Cancer Mother   . Hypertension Mother   . Cancer Father   . Hypertension Father   . Colon cancer Neg Hx     Prior to Admission medications   Medication Sig Start Date End Date Taking? Authorizing Provider  acetaminophen (TYLENOL) 500 MG tablet Take 1 tablet (500 mg total) by mouth every 6 (six) hours as needed for mild pain. 01/24/16   Fayrene Helper, MD  atorvastatin (LIPITOR) 10 MG tablet Take  1 tablet (10 mg total) by mouth daily. 01/28/17   Fayrene Helper, MD  enalapril (VASOTEC) 20 MG tablet 2 tabs daily 05/01/17   Fayrene Helper, MD  ergocalciferol (VITAMIN D2) 50000 units capsule Take 1 capsule (50,000 Units total) by mouth once a week. One capsule once weekly 01/28/17   Fayrene Helper, MD  ezetimibe (ZETIA) 10 MG tablet Take 1 tablet (10 mg total) by mouth at bedtime. 01/28/17   Fayrene Helper, MD  furosemide (LASIX) 20 MG tablet One tablet once daily , as needed, for leg swelling, do not use more than 3 tablets in any one week 04/10/15   Fayrene Helper, MD  hydrochlorothiazide (HYDRODIURIL) 25 MG tablet Take 1 tablet (25 mg total) by  mouth every morning. 05/01/17   Fayrene Helper, MD  hydrOXYzine (ATARAX/VISTARIL) 10 MG tablet One tablet at bedtime ,a s needed, for itching 05/23/16   Fayrene Helper, MD  levothyroxine (SYNTHROID) 50 MCG tablet Half tablet daily before breakfast every Monday, Wednesday, Friday and Sunday 01/28/17   Fayrene Helper, MD  montelukast (SINGULAIR) 10 MG tablet Take 1 tablet (10 mg total) by mouth at bedtime. 10/20/16   Fayrene Helper, MD  Multiple Vitamin (MULTIVITAMIN WITH MINERALS) TABS tablet Take 1 tablet by mouth daily.    [provider]  NIFEdipine (PROCARDIA-XL/ADALAT CC) 30 MG 24 hr tablet Take 1 tablet (30 mg total) by mouth daily. 05/01/17   Fayrene Helper, MD  omeprazole (PRILOSEC) 40 MG capsule Take 1 capsule (40 mg total) by mouth daily. 05/01/17   Fayrene Helper, MD  tobramycin (TOBREX) 0.3 % ophthalmic solution Place 2 drops into the left eye every 4 (four) hours. 09/13/15   Fransico Meadow, PA-C  traMADol (ULTRAM) 50 MG tablet Take 1 tablet (50 mg total) by mouth every 12 (twelve) hours as needed. 12/09/16   Fayrene Helper, MD    Review of Systems:  Unable to obtain given her confusion   Physical Exam: Vitals:   10/08/2017 1327 10/03/2017 1350 09/27/2017 1515 09/26/2017 1652  BP:   103/89 129/85  Pulse:   (!) 123 (!) 133  Resp:   20 (!) 23  Temp:  (!) 96.3 F (35.7 C)    TempSrc:  Rectal    SpO2:   96% 96%  Weight: 48.5 kg (107 lb)     Height: 5\' 5"  (1.651 m)        Constitutional: NAD, calm, comfortable Eyes: PERRL, lids and conjunctivae normal ENMT: Mucous membranes are moist. Posterior pharynx clear of any exudate or lesions.Normal dentition.  Neck: normal, supple, no masses, no thyromegaly Respiratory: clear to auscultation bilaterally, no wheezing, no crackles. Normal respiratory effort. No accessory muscle use.  Cardiovascular: Regular rate and rhythm, no murmurs / rubs / gallops.  Left greater than right lower extremity edema 2+ pedal  pulses. No carotid bruits.  Abdomen: no tenderness, no masses palpated. No hepatosplenomegaly. Bowel sounds positive.  Musculoskeletal: no clubbing / cyanosis. No joint deformity upper and lower extremities. Good ROM, no contractures. Normal muscle tone.  Skin: no rashes, lesions, ulcers. No induration Neurologic: Unable to fully assess given current mental state    Labs on Admission: I have personally reviewed the following labs and imaging studies  CBC:  Recent Labs Lab 09/27/2017 1352  WBC 12.4*  NEUTROABS 11.5*  HGB 8.1*  HCT 29.5*  MCV 71.8*  PLT 654   Basic Metabolic Panel:  Recent Labs Lab 10/05/2017 1352  NA 143  K 4.5  CL 111  CO2 18*  GLUCOSE 111*  BUN 77*  CREATININE 1.83*  CALCIUM 9.5   GFR: Estimated Creatinine Clearance: 17.8 mL/min (A) (by C-G formula based on SCr of 1.83 mg/dL (H)). Liver Function Tests:  Recent Labs Lab 09/25/2017 1352  AST 71*  ALT 77*  ALKPHOS 201*  BILITOT 1.5*  PROT 5.9*  ALBUMIN 3.1*    Recent Labs Lab 10/12/2017 1352  LIPASE 24    Recent Labs Lab 10/06/2017 1352  AMMONIA 13   Coagulation Profile:  Recent Labs Lab 10/05/2017 1352  INR 1.99   Cardiac Enzymes:  Recent Labs Lab 10/08/2017 1352 09/28/2017 1600  CKTOTAL  --  111  TROPONINI 4.90*  --    BNP (last 3 results) No results for input(s): PROBNP in the last 8760 hours. HbA1C: No results for input(s): HGBA1C in the last 72 hours. CBG: No results for input(s): GLUCAP in the last 168 hours. Lipid Profile: No results for input(s): CHOL, HDL, LDLCALC, TRIG, CHOLHDL, LDLDIRECT in the last 72 hours. Thyroid Function Tests:  Recent Labs  09/27/2017 1352  TSH 1.893   Anemia Panel: No results for input(s): VITAMINB12, FOLATE, FERRITIN, TIBC, IRON, RETICCTPCT in the last 72 hours. Urine analysis:    Component Value Date/Time   COLORURINE YELLOW 09/17/2017 1347   APPEARANCEUR HAZY (A) 09/30/2017 1347   LABSPEC 1.016 10/09/2017 1347   PHURINE 5.0  09/17/2017 1347   GLUCOSEU NEGATIVE 09/24/2017 1347   GLUCOSEU NEG mg/dL 06/04/2007 0145   HGBUR NEGATIVE 10/07/2017 1347   BILIRUBINUR NEGATIVE 09/25/2017 1347   KETONESUR NEGATIVE 10/11/2017 1347   PROTEINUR NEGATIVE 10/13/2017 1347   UROBILINOGEN 0.2 06/04/2007 0145   NITRITE NEGATIVE 10/13/2017 1347   LEUKOCYTESUR TRACE (A) 10/02/2017 1347   Sepsis Labs: @LABRCNTIP (procalcitonin:4,lacticidven:4) )No results found for this or any previous visit (from the past 240 hour(s)).   Radiological Exams on Admission: Ct Abdomen Pelvis Wo Contrast  Result Date: 10/03/2017 CLINICAL DATA:  Initial evaluation for acute altered mental status. Abdominal pain. EXAM: CT ABDOMEN AND PELVIS WITHOUT CONTRAST TECHNIQUE: Multidetector CT imaging of the abdomen and pelvis was performed following the standard protocol without IV contrast. COMPARISON:  Prior CT from 05/22/2014. FINDINGS: Lower chest: Moderate bilateral pleural effusions, right slightly larger than left. Associated bibasilar atelectasis. Moderate cardiomegaly. Decreased density within the cardiac blood pool suggests anemia. Three vessel coronary artery calcifications. Trace pericardial effusion. Hepatobiliary: Limited noncontrast evaluation of the liver is grossly unremarkable. Few punctate granulomas noted within the left hepatic lobe. Cholelithiasis. No findings to suggest acute cholecystitis on this noncontrast examination. No appreciable biliary dilatation. Pancreas: Pancreas grossly stable in appearance without acute abnormality. Previously seen cystic lucency within the pancreatic body not well delineated on this noncontrast examination. Irregular pancreatic ductal dilatation grossly stable. No acute abnormality about the pancreas. Spleen: Spleen within normal limits. Adrenals/Urinary Tract: Adrenal glands unremarkable. Kidneys fairly equal in size. Nonobstructive calculi measuring up to 5 mm present within the lower pole of the right kidney. Few  scattered hyperdense lesions within the bilateral kidneys, largest of which positioned within the interpolar left kidney measure up to 11 mm. While these are indeterminate, findings favored to reflect a small proteinaceous and/ or hemorrhagic cyst. These are new from prior exam. Additional simple right renal cyst noted. No appreciable hydroureter. Bladder moderately distended without acute abnormality. Stomach/Bowel: Stomach within normal limits. No evidence for bowel obstruction. Colonic diverticulosis without evidence for acute diverticulitis. No definite acute inflammatory changes seen about the  bowels. Vascular/Lymphatic: Extensive atherosclerosis throughout the intra- abdominal aorta and its branch vessels. No aneurysm. No appreciable adenopathy identified on this noncontrast examination. Reproductive: Uterus not well visualized, and may be absent. No adnexal mass. Ovaries not discretely identified. Other: No free intraperitoneal air. Diffuse mesenteric edema without frank free fluid, suspected to be related to volume status. Musculoskeletal: Diffuse anasarca noted. The no acute osseus abnormality. No worrisome lytic or blastic osseous lesions. Thoracolumbar scoliosis noted. IMPRESSION: 1. Cardiomegaly with moderate bilateral pleural effusions, mesenteric edema, and diffuse anasarca. Findings suspected to be related to underlying cardiac dysfunction. 2. No other acute intra-abdominal or pelvic process identified. 3. Advanced atherosclerosis with diffuse 3 vessel coronary artery calcifications. 4. Colonic diverticulosis without evidence for acute diverticulitis. 5. Cholelithiasis. 6. Nonobstructive right renal nephrolithiasis. 7. Scattered bilateral hyperdense renal lesions, indeterminate, but new from 2015. While these are suspected to reflect proteinaceous and/or hemorrhagic cysts, these are not well evaluated on this noncontrast examination. Further evaluation with renal mass protocol CT suggested for full  characterization. This would be most appropriately performed on a nonemergent outpatient basis. Electronically Signed   By: Jeannine Boga M.D.   On: 09/24/2017 16:21   Dg Chest 1 View  Result Date: 10/03/2017 CLINICAL DATA:  Altered mental status EXAM: CHEST 1 VIEW COMPARISON:  May 22, 2014 FINDINGS: There is a small left pleural effusion. There is a minimal right pleural effusion. There is patchy atelectasis in the left base. Lungs elsewhere clear. Heart size and pulmonary vascularity are normal. No adenopathy. There is aortic atherosclerosis. No evident bone lesions. IMPRESSION: Small pleural effusions bilaterally, left larger than right. Atelectatic change left base. Lungs elsewhere clear. Stable cardiac silhouette. There is aortic atherosclerosis. Aortic Atherosclerosis (ICD10-I70.0). Electronically Signed   By: Lowella Grip III M.D.   On: 10/16/2017 14:48   Ct Head Wo Contrast  Result Date: 10/07/2017 CLINICAL DATA:  Altered level of consciousness EXAM: CT HEAD WITHOUT CONTRAST TECHNIQUE: Contiguous axial images were obtained from the base of the skull through the vertex without intravenous contrast. COMPARISON:  None. FINDINGS: Brain: No evidence of acute infarction, hemorrhage, hydrocephalus, extra-axial collection or mass lesion/mass effect. Cortical atrophy asymmetric to the right, most convincing at the level of the temporal lobe. Mild cerebellar atrophy. Mild chronic small vessel ischemia. Vascular: Atherosclerotic calcification. Peripherally calcified structure along the right cavernous carotid, favor aneurysm over tortuous vessel based on reformats Skull: Heterogeneous density likely from osteopenia. No acute or focal aggressive finding. Sinuses/Orbits: Negative IMPRESSION: 1. No acute finding. 2. Cortical atrophy asymmetrically involving the right temporal lobe. 3. Probable 8 mm right cavernous ICA aneurysm. Electronically Signed   By: Monte Fantasia M.D.   On: 10/06/2017 16:00    US Venous Img Lower Unilateral Left  Result Date: 10/12/2017 CLINICAL DATA:  Weakness. EXAM: Left LOWER EXTREMITY VENOUS DOPPLER ULTRASOUND TECHNIQUE: Gray-scale sonography with graded compression, as well as color Doppler and duplex ultrasound were performed to evaluate the lower extremity deep venous systems from the level of the common femoral vein and including the common femoral, femoral, profunda femoral, popliteal and calf veins including the posterior tibial, peroneal and gastrocnemius veins when visible. The superficial great saphenous vein was also interrogated. Spectral Doppler was utilized to evaluate flow at rest and with distal augmentation maneuvers in the common femoral, femoral and popliteal veins. COMPARISON:  None. FINDINGS: Contralateral Common Femoral Vein: Respiratory phasicity is normal and symmetric with the symptomatic side. No evidence of thrombus. Normal compressibility. Common Femoral Vein: Noncompressible with partial flow consistent with  thrombosis. Saphenofemoral Junction: Noncompressible with no flow consistent with occlusive thrombus. Profunda Femoral Vein: Noncompressible with partial flow consistent with thrombosis. Femoral Vein: Noncompressible with no flow consistent with occlusive thrombus. Popliteal Vein: Noncompressible with no flow consistent with occlusive thrombus. Calf Veins: Noncompressible with no flow consistent with occlusive thrombus. Venous Reflux:  None. Other Findings:  None. IMPRESSION: Acute deep venous thrombosis is seen involving the left common femoral, profunda femoral, superficial femoral, popliteal and calf veins. Electronically Signed   By: Marijo Conception, M.D.   On: 10/12/2017 15:11   Dg Foot Complete Left  Result Date: 09/17/2017 CLINICAL DATA:  Pain and swelling EXAM: LEFT FOOT - COMPLETE 3+ VIEW COMPARISON:  None. FINDINGS: Frontal, oblique, and lateral views were obtained. There is mild generalized soft tissue swelling. Bones are  osteoporotic. No evident fracture or dislocation. Joint spaces are unremarkable. No erosive change. There are scattered foci of arterial vascular calcification in the ankle region. IMPRESSION: No fracture or dislocation. Bones osteoporotic. Mild soft tissue swelling. No appreciable joint space narrowing or erosion. Trifurcation arterial vessel atherosclerosis in the ankle region. Electronically Signed   By: Lowella Grip III M.D.   On: 09/19/2017 14:51    EKG: Independently reviewed.  Atrial fibrillation at a rate of 123 inferior and anterior Q waves.  Assessment/Plan Principal Problem:   NSTEMI (non-ST elevated myocardial infarction) ([place]) Active Problems:   Atrial fibrillation with RVR ([facility])   Anemia   Leukocytosis   ARF (acute renal failure) ([facility])    Elevated troponin/NSTEMI -This is in the setting of multiple metabolic abnormalities. -It is unclear as to whether this is demand ischemia due to global hypoperfusion and possible sepsis versus a primary coronary event. -Has been seen by cardiology who has initiated a heparin drip. -2D echo has been requested. -Continue to cycle troponins, repeat EKG in a.m.  Hypothermia/leukocytosis -No source of infection is evident, UA and chest x-ray look okay. -Blood and urine cultures have been requested. -We will not start antibiotics for now as I am not quite sure if she truly has sepsis or whether the hypothermia may be due to a primary coronary event.  Atrial fibrillation with rapid ventricular response -This is new onset, likely due to to acute metabolic issues.  Also with acute DVT need to consider possibility of PE being the inciting event. -Seen by cardiology, they are hesitant to start AV nodal agents given initial low blood pressures.  They have recommended initiation of amiodarone for now.  Acute left lower extremity DVT -On heparin drip, possibility of PE has not been discarded at this point.,  Acute renal failure -Creatinine  is 1.83 today, was 0.9 911 months ago. -Continue IV fluids.  Suspect prerenal azotemia given acute metabolic issues.  Anemia -Given her elevated troponin and possible NSTEMI, I believe it would be prudent to transfuse her at least 1 unit of PRBCs, this has been ordered.  Transaminitis -Suspect hypoperfusion injury, monitor.  Anasarca, probable CHF -Echo pending, given initial hypotension and other metabolic derangements that require IV fluids, will not place on standing Lasix pending improvement in blood pressure and renal function.   DVT prophylaxis: IV heparin Code Status: Presumed full code Family Communication: Left message on voicemail for daughter's number listed in chart Disposition Plan: To be determined, transferred to [place] called: Seen by cardiology Admission status: Inpatient   Critical care time spent: 70 minutes  Lelon Frohlich MD [facility] Pager [phone]  If 7PM-7AM, please contact night-coverage [email or web address] Password [email or web address]  10/08/2017, 6:33 PM

## 2017-09-18 NOTE — ED Notes (Signed)
No transfusion suspected at time of transfer, amio, blood and heparin infusing at time of transfer

## 2017-09-18 NOTE — ED Notes (Signed)
Date and time results received: 10/15/2017 3:08 PM  (use smartphrase ".now" to insert current time)  Test: Troponin Critical Value: 4.90   Name of Provider Notified: Lita Mains  Orders Received? Or Actions Taken?: Orders Received - See Orders for details

## 2017-09-18 NOTE — ED Triage Notes (Signed)
Pt brought to ED for Altered Mental Status. Pt's mailman called [employer] because he hadn't seen pt in a few days. Pt oriented to person and birthday only. Pt lives alone. Unable to contact any family at present.

## 2017-09-18 NOTE — ED Notes (Signed)
Per bryan, rn, the pt does not have POA and unable to sign blood consent d/t AMS. Physician has been notified and will be coming to sign consent for blood. Delay of blood transfusion d/t no consent available.

## 2017-09-18 NOTE — ED Provider Notes (Signed)
[facility] EMERGENCY DEPARTMENT Provider Note   CSN: [id number] Arrival date & time: 09/20/2017  1326     History   Chief Complaint Chief Complaint  Patient presents with  . Altered Mental Status    HPI Tina Goodwin is a 83 y.o. female.  HPI Level 5 caveat due to altered mental status.  Per EMS, [facility] noticed patient had not been picking up her mouth for the past few days.  Called police department who found patient on couch.  Incontinence of urine.  No obvious trauma. Past Medical History:  Diagnosis Date  . Allergic rhinitis   . Anemia, iron deficiency   . Cholecystitis chronic   . COPD (chronic obstructive pulmonary disease) ([place])   . Depression   . Dysuria   . Hyperlipidemia   . Hypothyroidism   . NASH (nonalcoholic steatohepatitis)   . Numbness of foot    Bilateral   . Renal mass   . Right forearm fracture   . Thyroid cancer ([place])   . Uncontrolled stage 2 hypertension     Patient Active Problem List   Diagnosis Date Noted  . NSTEMI (non-ST elevated myocardial infarction) ([place]) 10/14/2017  . Atrial fibrillation with RVR ([place]) 10/11/2017  . Anemia 09/28/2017  . Leukocytosis 09/23/2017  . ARF (acute renal failure) ([place]) 10/07/2017  . Malnutrition ([place]) 10/07/2015  . Unintentional weight loss 10/07/2015  . Dental caries 10/06/2015  . Seasonal allergies 04/10/2015  . Leg pain, right 10/01/2014  . Vision loss of left eye 09/26/2014  . Other constipation 07/31/2014  . Encounter for screening colonoscopy 05/31/2014  . Pancreatic lesion 05/22/2014  . Abnormal weight loss 05/17/2014  . Symptomatic sinus bradycardia 02/04/2014  . Back pain with radiation 08/04/2013  . Cholelithiasis 07/08/2011  . FATIGUE 07/05/2010  . Gout, unspecified 03/01/2010  . DYSPEPSIA 03/01/2010  . LIVER MASS 11/26/2007  . Hypothyroidism 11/26/2007  . Hyperlipidemia 11/26/2007  . Iron deficiency anemia 11/26/2007  . Essential hypertension 11/26/2007  . OTHER CHRONIC  NONALCOHOLIC LIVER DISEASE 29/52/8413  . FRACTURE, ARM, RIGHT 11/26/2007  . TOBACCO ABUSE, HX OF 11/26/2007    Past Surgical History:  Procedure Laterality Date  . BACK SURGERY     x2  . EUS N/A 06/15/2014   Procedure: UPPER ENDOSCOPIC ULTRASOUND (EUS) LINEAR;  Surgeon: Milus Banister, MD;  Location: [facility] ENDOSCOPY;  Service: Endoscopy;  Laterality: N/A;  . EYE SURGERY Left    [facility]  . EYE SURGERY Right   . Left cataract extraction    . Right cataract extraction    . SPINE SURGERY     two times  . THYROIDECTOMY    . VESICOVAGINAL FISTULA CLOSURE W/ TAH      OB History    No data available       Home Medications    Prior to Admission medications   Medication Sig Start Date End Date Taking? Authorizing Provider  acetaminophen (TYLENOL) 500 MG tablet Take 1 tablet (500 mg total) by mouth every 6 (six) hours as needed for mild pain. 01/24/16   Fayrene Helper, MD  atorvastatin (LIPITOR) 10 MG tablet Take 1 tablet (10 mg total) by mouth daily. 01/28/17   Fayrene Helper, MD  enalapril (VASOTEC) 20 MG tablet 2 tabs daily 05/01/17   Fayrene Helper, MD  ergocalciferol (VITAMIN D2) 50000 units capsule Take 1 capsule (50,000 Units total) by mouth once a week. One capsule once weekly 01/28/17   Fayrene Helper, MD  ezetimibe Chauncey Cruel)  10 MG tablet Take 1 tablet (10 mg total) by mouth at bedtime. 01/28/17   Fayrene Helper, MD  furosemide (LASIX) 20 MG tablet One tablet once daily , as needed, for leg swelling, do not use more than 3 tablets in any one week 04/10/15   Fayrene Helper, MD  hydrochlorothiazide (HYDRODIURIL) 25 MG tablet Take 1 tablet (25 mg total) by mouth every morning. 05/01/17   Fayrene Helper, MD  hydrOXYzine (ATARAX/VISTARIL) 10 MG tablet One tablet at bedtime ,a s needed, for itching 05/23/16   Fayrene Helper, MD  levothyroxine (SYNTHROID) 50 MCG tablet Half tablet daily before breakfast every Monday, Wednesday, Friday and Sunday 01/28/17    Fayrene Helper, MD  montelukast (SINGULAIR) 10 MG tablet Take 1 tablet (10 mg total) by mouth at bedtime. 10/20/16   Fayrene Helper, MD  Multiple Vitamin (MULTIVITAMIN WITH MINERALS) TABS tablet Take 1 tablet by mouth daily.    [provider]  NIFEdipine (PROCARDIA-XL/ADALAT CC) 30 MG 24 hr tablet Take 1 tablet (30 mg total) by mouth daily. 05/01/17   Fayrene Helper, MD  omeprazole (PRILOSEC) 40 MG capsule Take 1 capsule (40 mg total) by mouth daily. 05/01/17   Fayrene Helper, MD  tobramycin (TOBREX) 0.3 % ophthalmic solution Place 2 drops into the left eye every 4 (four) hours. 09/13/15   Fransico Meadow, PA-C  traMADol (ULTRAM) 50 MG tablet Take 1 tablet (50 mg total) by mouth every 12 (twelve) hours as needed. 12/09/16   Fayrene Helper, MD    Family History Family History  Problem Relation Age of Onset  . Cancer Mother   . Hypertension Mother   . Cancer Father   . Hypertension Father   . Colon cancer Neg Hx     Social History Social History  Substance Use Topics  . Smoking status: Former Smoker    Types: Cigarettes    Quit date: 07/31/2001  . Smokeless tobacco: Never Used  . Alcohol use No     Allergies   Patient has no known allergies.   Review of Systems Review of Systems  Unable to perform ROS: Mental status change     Physical Exam Updated Vital Signs BP 129/85 (BP Location: Right Arm)   Pulse (!) 133   Temp (!) 96.3 F (35.7 C) (Rectal)   Resp (!) 23   Ht 5\' 5"  (1.651 m)   Wt 48.5 kg (107 lb)   SpO2 96%   BMI 17.81 kg/m   Physical Exam  Constitutional: She appears well-developed.  Wasted appearing  HENT:  Head: Normocephalic and atraumatic.  Dry mucous membranes  Eyes: Pupils are equal, round, and reactive to light. EOM are normal.  Neck: Normal range of motion. Neck supple.  No meningismus  Cardiovascular:  Irregularly irregular.  Tachycardia.  Pulmonary/Chest: Effort normal and breath sounds normal.  Abdominal:  Soft. Bowel sounds are normal. There is no tenderness. There is no rebound and no guarding.  Prominent liver edge.  Musculoskeletal: Normal range of motion. She exhibits edema. She exhibits no tenderness.  2+ edema in the left lower extremity compared to 1+ edema in the right lower extremity.  The left foot is mildly erythematous and very tender to palpation.  Neurological: She is alert.  Oriented to person.  Generally weak but appears to be moving all extremities.  Sensation intact.  Skin: Skin is warm and dry. No rash noted. There is erythema.  Psychiatric: Her behavior is normal.  Nursing  note and vitals reviewed.    ED Treatments / Results  Labs (all labs ordered are listed, but only abnormal results are displayed) Labs Reviewed  CBC WITH DIFFERENTIAL/PLATELET - Abnormal; Notable for the following:       Result Value   WBC 12.4 (*)    Hemoglobin 8.1 (*)    HCT 29.5 (*)    MCV 71.8 (*)    MCH 19.7 (*)    MCHC 27.5 (*)    RDW 18.4 (*)    Neutro Abs 11.5 (*)    Lymphs Abs 0.4 (*)    All other components within normal limits  COMPREHENSIVE METABOLIC PANEL - Abnormal; Notable for the following:    CO2 18 (*)    Glucose, Bld 111 (*)    BUN 77 (*)    Creatinine, Ser 1.83 (*)    Total Protein 5.9 (*)    Albumin 3.1 (*)    AST 71 (*)    ALT 77 (*)    Alkaline Phosphatase 201 (*)    Total Bilirubin 1.5 (*)    GFR calc non Af Amer 24 (*)    GFR calc Af Amer 28 (*)    All other components within normal limits  BRAIN NATRIURETIC PEPTIDE - Abnormal; Notable for the following:    B Natriuretic Peptide 1,061.0 (*)    All other components within normal limits  PROTIME-INR - Abnormal; Notable for the following:    Prothrombin Time 22.4 (*)    All other components within normal limits  TROPONIN I - Abnormal; Notable for the following:    Troponin I 4.90 (*)    All other components within normal limits  URINALYSIS, ROUTINE W REFLEX MICROSCOPIC - Abnormal; Notable for the following:     APPearance HAZY (*)    Leukocytes, UA TRACE (*)    Bacteria, UA MANY (*)    Squamous Epithelial / LPF 0-5 (*)    All other components within normal limits  I-STAT CG4 LACTIC ACID, ED - Abnormal; Notable for the following:    Lactic Acid, Venous 2.68 (*)    All other components within normal limits  URINE CULTURE  LIPASE, BLOOD  AMMONIA  RAPID URINE DRUG SCREEN, HOSP PERFORMED  ETHANOL  TSH  CK  APTT  T4, FREE  TYPE AND SCREEN  PREPARE RBC (CROSSMATCH)    EKG  EKG Interpretation  Date/Time:  Friday September 18 2017 13:30:05 EDT Ventricular Rate:  134 PR Interval:    QRS Duration: 94 QT Interval:  336 QTC Calculation: 502 R Axis:   -47 Text Interpretation:  Age not entered, assumed to be  83 years old for purpose of ECG interpretation Atrial fibrillation LVH with secondary repolarization abnormality Inferior infarct, age indeterminate Anterior Q waves, possibly due to LVH Lateral leads are also involved Prolonged QT interval Baseline wander in lead(s) V3 Confirmed by Julianne Rice [phone]) on 10/03/2017 3:28:03 PM       Radiology Ct Abdomen Pelvis Wo Contrast  Result Date: 10/05/2017 CLINICAL DATA:  Initial evaluation for acute altered mental status. Abdominal pain. EXAM: CT ABDOMEN AND PELVIS WITHOUT CONTRAST TECHNIQUE: Multidetector CT imaging of the abdomen and pelvis was performed following the standard protocol without IV contrast. COMPARISON:  Prior CT from 05/22/2014. FINDINGS: Lower chest: Moderate bilateral pleural effusions, right slightly larger than left. Associated bibasilar atelectasis. Moderate cardiomegaly. Decreased density within the cardiac blood pool suggests anemia. Three vessel coronary artery calcifications. Trace pericardial effusion. Hepatobiliary: Limited noncontrast evaluation of the liver is  grossly unremarkable. Few punctate granulomas noted within the left hepatic lobe. Cholelithiasis. No findings to suggest acute cholecystitis on this  noncontrast examination. No appreciable biliary dilatation. Pancreas: Pancreas grossly stable in appearance without acute abnormality. Previously seen cystic lucency within the pancreatic body not well delineated on this noncontrast examination. Irregular pancreatic ductal dilatation grossly stable. No acute abnormality about the pancreas. Spleen: Spleen within normal limits. Adrenals/Urinary Tract: Adrenal glands unremarkable. Kidneys fairly equal in size. Nonobstructive calculi measuring up to 5 mm present within the lower pole of the right kidney. Few scattered hyperdense lesions within the bilateral kidneys, largest of which positioned within the interpolar left kidney measure up to 11 mm. While these are indeterminate, findings favored to reflect a small proteinaceous and/ or hemorrhagic cyst. These are new from prior exam. Additional simple right renal cyst noted. No appreciable hydroureter. Bladder moderately distended without acute abnormality. Stomach/Bowel: Stomach within normal limits. No evidence for bowel obstruction. Colonic diverticulosis without evidence for acute diverticulitis. No definite acute inflammatory changes seen about the bowels. Vascular/Lymphatic: Extensive atherosclerosis throughout the intra- abdominal aorta and its branch vessels. No aneurysm. No appreciable adenopathy identified on this noncontrast examination. Reproductive: Uterus not well visualized, and may be absent. No adnexal mass. Ovaries not discretely identified. Other: No free intraperitoneal air. Diffuse mesenteric edema without frank free fluid, suspected to be related to volume status. Musculoskeletal: Diffuse anasarca noted. The no acute osseus abnormality. No worrisome lytic or blastic osseous lesions. Thoracolumbar scoliosis noted. IMPRESSION: 1. Cardiomegaly with moderate bilateral pleural effusions, mesenteric edema, and diffuse anasarca. Findings suspected to be related to underlying cardiac dysfunction. 2. No  other acute intra-abdominal or pelvic process identified. 3. Advanced atherosclerosis with diffuse 3 vessel coronary artery calcifications. 4. Colonic diverticulosis without evidence for acute diverticulitis. 5. Cholelithiasis. 6. Nonobstructive right renal nephrolithiasis. 7. Scattered bilateral hyperdense renal lesions, indeterminate, but new from 2015. While these are suspected to reflect proteinaceous and/or hemorrhagic cysts, these are not well evaluated on this noncontrast examination. Further evaluation with renal mass protocol CT suggested for full characterization. This would be most appropriately performed on a nonemergent outpatient basis. Electronically Signed   By: Jeannine Boga M.D.   On: 09/19/2017 16:21   Dg Chest 1 View  Result Date: 10/08/2017 CLINICAL DATA:  Altered mental status EXAM: CHEST 1 VIEW COMPARISON:  May 22, 2014 FINDINGS: There is a small left pleural effusion. There is a minimal right pleural effusion. There is patchy atelectasis in the left base. Lungs elsewhere clear. Heart size and pulmonary vascularity are normal. No adenopathy. There is aortic atherosclerosis. No evident bone lesions. IMPRESSION: Small pleural effusions bilaterally, left larger than right. Atelectatic change left base. Lungs elsewhere clear. Stable cardiac silhouette. There is aortic atherosclerosis. Aortic Atherosclerosis (ICD10-I70.0). Electronically Signed   By: Lowella Grip III M.D.   On: 09/25/2017 14:48   Ct Head Wo Contrast  Result Date: 09/21/2017 CLINICAL DATA:  Altered level of consciousness EXAM: CT HEAD WITHOUT CONTRAST TECHNIQUE: Contiguous axial images were obtained from the base of the skull through the vertex without intravenous contrast. COMPARISON:  None. FINDINGS: Brain: No evidence of acute infarction, hemorrhage, hydrocephalus, extra-axial collection or mass lesion/mass effect. Cortical atrophy asymmetric to the right, most convincing at the level of the temporal lobe.  Mild cerebellar atrophy. Mild chronic small vessel ischemia. Vascular: Atherosclerotic calcification. Peripherally calcified structure along the right cavernous carotid, favor aneurysm over tortuous vessel based on reformats Skull: Heterogeneous density likely from osteopenia. No acute or focal aggressive finding. Sinuses/Orbits:  Negative IMPRESSION: 1. No acute finding. 2. Cortical atrophy asymmetrically involving the right temporal lobe. 3. Probable 8 mm right cavernous ICA aneurysm. Electronically Signed   By: Monte Fantasia M.D.   On: 10/10/2017 16:00   US Venous Img Lower Unilateral Left  Result Date: 09/30/2017 CLINICAL DATA:  Weakness. EXAM: Left LOWER EXTREMITY VENOUS DOPPLER ULTRASOUND TECHNIQUE: Gray-scale sonography with graded compression, as well as color Doppler and duplex ultrasound were performed to evaluate the lower extremity deep venous systems from the level of the common femoral vein and including the common femoral, femoral, profunda femoral, popliteal and calf veins including the posterior tibial, peroneal and gastrocnemius veins when visible. The superficial great saphenous vein was also interrogated. Spectral Doppler was utilized to evaluate flow at rest and with distal augmentation maneuvers in the common femoral, femoral and popliteal veins. COMPARISON:  None. FINDINGS: Contralateral Common Femoral Vein: Respiratory phasicity is normal and symmetric with the symptomatic side. No evidence of thrombus. Normal compressibility. Common Femoral Vein: Noncompressible with partial flow consistent with thrombosis. Saphenofemoral Junction: Noncompressible with no flow consistent with occlusive thrombus. Profunda Femoral Vein: Noncompressible with partial flow consistent with thrombosis. Femoral Vein: Noncompressible with no flow consistent with occlusive thrombus. Popliteal Vein: Noncompressible with no flow consistent with occlusive thrombus. Calf Veins: Noncompressible with no flow consistent  with occlusive thrombus. Venous Reflux:  None. Other Findings:  None. IMPRESSION: Acute deep venous thrombosis is seen involving the left common femoral, profunda femoral, superficial femoral, popliteal and calf veins. Electronically Signed   By: Marijo Conception, M.D.   On: 10/08/2017 15:11   Dg Foot Complete Left  Result Date: 10/06/2017 CLINICAL DATA:  Pain and swelling EXAM: LEFT FOOT - COMPLETE 3+ VIEW COMPARISON:  None. FINDINGS: Frontal, oblique, and lateral views were obtained. There is mild generalized soft tissue swelling. Bones are osteoporotic. No evident fracture or dislocation. Joint spaces are unremarkable. No erosive change. There are scattered foci of arterial vascular calcification in the ankle region. IMPRESSION: No fracture or dislocation. Bones osteoporotic. Mild soft tissue swelling. No appreciable joint space narrowing or erosion. Trifurcation arterial vessel atherosclerosis in the ankle region. Electronically Signed   By: Lowella Grip III M.D.   On: 10/14/2017 14:51    Procedures Procedures (including critical care time)  Medications Ordered in ED Medications  amiodarone (NEXTERONE) 1.8 mg/mL load via infusion 150 mg (not administered)    Followed by  amiodarone (NEXTERONE PREMIX) 360-4.14 MG/200ML-% (1.8 mg/mL) IV infusion (not administered)    Followed by  amiodarone (NEXTERONE PREMIX) 360-4.14 MG/200ML-% (1.8 mg/mL) IV infusion (not administered)  heparin bolus via infusion 4,000 Units (not administered)  heparin ADULT infusion 100 units/mL (25000 units/24mL sodium chloride 0.45%) (not administered)  0.9 %  sodium chloride infusion (not administered)  sodium chloride 0.9 % bolus 500 mL (0 mLs Intravenous Stopped 10/16/2017 1600)  sodium chloride 0.9 % bolus 500 mL (500 mLs Intravenous New Bag/Given 10/14/2017 1656)   CRITICAL CARE Performed by: Lita Mains, Collette Pescador Total critical care time: 35 minutes Critical care time was exclusive of separately billable procedures  and treating other patients. Critical care was necessary to treat or prevent imminent or life-threatening deterioration. Critical care was time spent personally by me on the following activities: development of treatment plan with patient and/or surrogate as well as nursing, discussions with consultants, evaluation of patient's response to treatment, examination of patient, obtaining history from patient or surrogate, ordering and performing treatments and interventions, ordering and review of laboratory studies, ordering and review of  radiographic studies, pulse oximetry and re-evaluation of patient's condition.  Initial Impression / Assessment and Plan / ED Course  I have reviewed the triage vital signs and the nursing notes.  Pertinent labs & imaging results that were available during my care of the patient were reviewed by me and considered in my medical decision making (see chart for details).    Discussed with Dr. Harl Bowie will see patient in the emergency department.  Patient's blood pressure significantly improved after IV fluids.  She has new onset atrial fibrillation with RVR, acute kidney injury, elevated troponin, elevated liver enzymes and DVT in the left lower extremity.  Her anemia appears to be chronic and stable.  CT head without acute findings.  Dr. Jerilee Hoh will see patient and arrange transfer to [facility] for likely catheterization at some point this admission.  Initiated heparin and have typed and screen for transfusion per cardiology recommendation.  Final Clinical Impressions(s) / ED Diagnoses   Final diagnoses:  NSTEMI (non-ST elevated myocardial infarction) ([place])  New onset atrial fibrillation ([facility])  AKI (acute kidney injury) ([place])  Deep vein thrombosis (DVT) of left lower extremity, unspecified chronicity, unspecified vein ([place])    New Prescriptions New Prescriptions   No medications on file     Julianne Rice, MD 09/17/2017 [phone]

## 2017-09-18 NOTE — Consult Note (Addendum)
 Cardiology Consultation:   Patient ID: Tina Goodwin; [id number]; 05/30/1932   Admit date: 10/08/2017 Date of Consult: 09/29/2017  Primary Care Provider: Simpson, Margaret E, MD Primary Cardiologist: Dr Sam McDowell Primary Electrophysiologist:  Dr Jonathan Branch   Patient Profile:   Tina Goodwin is a 83 y.o. female with a hx of HTN, DM2, NASH, COPD, chronic anemia who is being seen today for the evaluation of afib at the request of Dr Yelverton.  History of Present Illness:   Ms. Scruggs history of HTN, DM2, NASH, COPD admitted with altered mental status. From ER notes mailman noticed patient had not been picking up her mail for a few days and called police. Found patient on cough altered and brought to ER.   Patient is alert but remains confused. She is not able to give clear history. She states she hurts all over. Cannot specify any specific location. Cannot report any events that occurred at home. When asked about SOB she states she feels weak all over.   In ER multiple issues noted. Patient hypothermic with WBC of 12.4. Anemia Hgb 8.1. AKI Cr 0.99 to 1.83. Elevated LFTs. Elevated lactic acid to 2.68. Trop 4.9. Patient in afib with RVR.   ER vitals p 123 bp 103/89 96% RA T 96.3 WBC 12.4, Hgb 8.1, Plt 200, CK 111, Na 143, K 4.5, BUN 77, Cr 1.83, BNP 1061, INR 1.99, lactic acid 2.68 02/2014 echo LVEF 65-70%, grade I diastolic dysfunction,  Trop 4.9--> LE US: left DVT CXR small pleural effusions bilaterally, L>R.  CT head no acute process.  CT A/P cardiomegaly with bilateral pleural effusions, diffuse anasarca, 3 vessel CAD EKG afib, inferior and lateral precordial ST/T changes, strain vs ischemia pattern   Past Medical History:  Diagnosis Date  . Allergic rhinitis   . Anemia, iron deficiency   . Cholecystitis chronic   . COPD (chronic obstructive pulmonary disease) (HCC)   . Depression   . Dysuria   . Hyperlipidemia   . Hypothyroidism   . NASH (nonalcoholic  steatohepatitis)   . Numbness of foot    Bilateral   . Renal mass   . Right forearm fracture   . Thyroid cancer (HCC)   . Uncontrolled stage 2 hypertension     Past Surgical History:  Procedure Laterality Date  . BACK SURGERY     x2  . EUS N/A 06/15/2014   Procedure: UPPER ENDOSCOPIC ULTRASOUND (EUS) LINEAR;  Surgeon: Daniel P Jacobs, MD;  Location: [facility];  Service: Endoscopy;  Laterality: N/A;  . EYE SURGERY Left    [facility]  . EYE SURGERY Right   . Left cataract extraction    . Right cataract extraction    . SPINE SURGERY     two times  . THYROIDECTOMY    . VESICOVAGINAL FISTULA CLOSURE W/ TAH       Inpatient Medications: Scheduled Meds:  Continuous Infusions: . sodium chloride    . sodium chloride     PRN Meds:   Allergies:   No Known Allergies  Social History:   Social History   Social History  . Marital status: Divorced    Spouse name: N/A  . Number of children: 2  . Years of education: N/A   Occupational History  . Plastic plant    .  Retired   Social History Main Topics  . Smoking status: Former Smoker    Types: Cigarettes    Quit date: 07/31/2001  . Smokeless tobacco: Never   Used  . Alcohol use No  . Drug use: No  . Sexual activity: Not Currently    Birth control/ protection: Surgical   Other Topics Concern  . Not on file   Social History Narrative  . No narrative on file    Family History:    Family History  Problem Relation Age of Onset  . Cancer Mother   . Hypertension Mother   . Cancer Father   . Hypertension Father   . Colon cancer Neg Hx      ROS:  Please see the history of present illness.  ROS unable to collect, patient does not answer questions.    Physical Exam/Data:   Vitals:   09/27/2017 1327 10/08/2017 1350 10/02/2017 1515  BP:   103/89  Pulse:   (!) 123  Resp:   20  Temp:  (!) 96.3 F (35.7 C)   TempSrc:  Rectal   SpO2:   96%  Weight: 107 lb (48.5 kg)    Height: 5' 5" (1.651 m)     No intake or  output data in the 24 hours ending 10/10/2017 1636 Filed Weights   10/14/2017 1327  Weight: 107 lb (48.5 kg)   Body mass index is 17.81 kg/m.  General:  Cachectic female in no acute distess HEENT: normal Lymph: no adenopathy Neck: elevated jvd Endocrine:  No thryomegaly Cardiac:  Irreg, no m/r/g Lungs:  Mild crackles bilateral bases Abd: soft, nontender, no hepatomegaly  Ext: no edema Musculoskeletal:  No deformities, BUE and BLE strength normal and equal Skin: warm and dry  Neuro:  CNs 2-12 intact, no focal abnormalities noted Psych:  Normal affect   EKG:  The EKG was personally reviewed and demonstrates: afib with RVR, inferior and lateral precordial ST/T changes Telemetry:  Telemetry was personally reviewed and demonstrates: afib with RVR    Laboratory Data:  Chemistry Recent Labs Lab 10/10/2017 1352  NA 143  K 4.5  CL 111  CO2 18*  GLUCOSE 111*  BUN 77*  CREATININE 1.83*  CALCIUM 9.5  GFRNONAA 24*  GFRAA 28*  ANIONGAP 14     Recent Labs Lab 10/09/2017 1352  PROT 5.9*  ALBUMIN 3.1*  AST 71*  ALT 77*  ALKPHOS 201*  BILITOT 1.5*   Hematology Recent Labs Lab 10/05/2017 1352  WBC 12.4*  RBC 4.11  HGB 8.1*  HCT 29.5*  MCV 71.8*  MCH 19.7*  MCHC 27.5*  RDW 18.4*  PLT 200   Cardiac Enzymes Recent Labs Lab 09/17/2017 1352  TROPONINI 4.90*   No results for input(s): TROPIPOC in the last 168 hours.  BNP Recent Labs Lab 10/14/2017 1352  BNP 1,061.0*    DDimer No results for input(s): DDIMER in the last 168 hours.  Radiology/Studies:  Ct Abdomen Pelvis Wo Contrast  Result Date: 10/05/2017 CLINICAL DATA:  Initial evaluation for acute altered mental status. Abdominal pain. EXAM: CT ABDOMEN AND PELVIS WITHOUT CONTRAST TECHNIQUE: Multidetector CT imaging of the abdomen and pelvis was performed following the standard protocol without IV contrast. COMPARISON:  Prior CT from 05/22/2014. FINDINGS: Lower chest: Moderate bilateral pleural effusions, right slightly  larger than left. Associated bibasilar atelectasis. Moderate cardiomegaly. Decreased density within the cardiac blood pool suggests anemia. Three vessel coronary artery calcifications. Trace pericardial effusion. Hepatobiliary: Limited noncontrast evaluation of the liver is grossly unremarkable. Few punctate granulomas noted within the left hepatic lobe. Cholelithiasis. No findings to suggest acute cholecystitis on this noncontrast examination. No appreciable biliary dilatation. Pancreas: Pancreas grossly stable in appearance without   acute abnormality. Previously seen cystic lucency within the pancreatic body not well delineated on this noncontrast examination. Irregular pancreatic ductal dilatation grossly stable. No acute abnormality about the pancreas. Spleen: Spleen within normal limits. Adrenals/Urinary Tract: Adrenal glands unremarkable. Kidneys fairly equal in size. Nonobstructive calculi measuring up to 5 mm present within the lower pole of the right kidney. Few scattered hyperdense lesions within the bilateral kidneys, largest of which positioned within the interpolar left kidney measure up to 11 mm. While these are indeterminate, findings favored to reflect a small proteinaceous and/ or hemorrhagic cyst. These are new from prior exam. Additional simple right renal cyst noted. No appreciable hydroureter. Bladder moderately distended without acute abnormality. Stomach/Bowel: Stomach within normal limits. No evidence for bowel obstruction. Colonic diverticulosis without evidence for acute diverticulitis. No definite acute inflammatory changes seen about the bowels. Vascular/Lymphatic: Extensive atherosclerosis throughout the intra- abdominal aorta and its branch vessels. No aneurysm. No appreciable adenopathy identified on this noncontrast examination. Reproductive: Uterus not well visualized, and may be absent. No adnexal mass. Ovaries not discretely identified. Other: No free intraperitoneal air. Diffuse  mesenteric edema without frank free fluid, suspected to be related to volume status. Musculoskeletal: Diffuse anasarca noted. The no acute osseus abnormality. No worrisome lytic or blastic osseous lesions. Thoracolumbar scoliosis noted. IMPRESSION: 1. Cardiomegaly with moderate bilateral pleural effusions, mesenteric edema, and diffuse anasarca. Findings suspected to be related to underlying cardiac dysfunction. 2. No other acute intra-abdominal or pelvic process identified. 3. Advanced atherosclerosis with diffuse 3 vessel coronary artery calcifications. 4. Colonic diverticulosis without evidence for acute diverticulitis. 5. Cholelithiasis. 6. Nonobstructive right renal nephrolithiasis. 7. Scattered bilateral hyperdense renal lesions, indeterminate, but new from 2015. While these are suspected to reflect proteinaceous and/or hemorrhagic cysts, these are not well evaluated on this noncontrast examination. Further evaluation with renal mass protocol CT suggested for full characterization. This would be most appropriately performed on a nonemergent outpatient basis. Electronically Signed   By: Benjamin  McClintock M.D.   On: 10/15/2017 16:21   Dg Chest 1 View  Result Date: 09/20/2017 CLINICAL DATA:  Altered mental status EXAM: CHEST 1 VIEW COMPARISON:  May 22, 2014 FINDINGS: There is a small left pleural effusion. There is a minimal right pleural effusion. There is patchy atelectasis in the left base. Lungs elsewhere clear. Heart size and pulmonary vascularity are normal. No adenopathy. There is aortic atherosclerosis. No evident bone lesions. IMPRESSION: Small pleural effusions bilaterally, left larger than right. Atelectatic change left base. Lungs elsewhere clear. Stable cardiac silhouette. There is aortic atherosclerosis. Aortic Atherosclerosis (ICD10-I70.0). Electronically Signed   By: William  Woodruff III M.D.   On: 10/16/2017 14:48   Ct Head Wo Contrast  Result Date: 10/16/2017 CLINICAL DATA:  Altered  level of consciousness EXAM: CT HEAD WITHOUT CONTRAST TECHNIQUE: Contiguous axial images were obtained from the base of the skull through the vertex without intravenous contrast. COMPARISON:  None. FINDINGS: Brain: No evidence of acute infarction, hemorrhage, hydrocephalus, extra-axial collection or mass lesion/mass effect. Cortical atrophy asymmetric to the right, most convincing at the level of the temporal lobe. Mild cerebellar atrophy. Mild chronic small vessel ischemia. Vascular: Atherosclerotic calcification. Peripherally calcified structure along the right cavernous carotid, favor aneurysm over tortuous vessel based on reformats Skull: Heterogeneous density likely from osteopenia. No acute or focal aggressive finding. Sinuses/Orbits: Negative IMPRESSION: 1. No acute finding. 2. Cortical atrophy asymmetrically involving the right temporal lobe. 3. Probable 8 mm right cavernous ICA aneurysm. Electronically Signed   By: Jonathon  Watts M.D.     On: 10/07/2017 16:00   Us Venous Img Lower Unilateral Left  Result Date: 10/13/2017 CLINICAL DATA:  Weakness. EXAM: Left LOWER EXTREMITY VENOUS DOPPLER ULTRASOUND TECHNIQUE: Gray-scale sonography with graded compression, as well as color Doppler and duplex ultrasound were performed to evaluate the lower extremity deep venous systems from the level of the common femoral vein and including the common femoral, femoral, profunda femoral, popliteal and calf veins including the posterior tibial, peroneal and gastrocnemius veins when visible. The superficial great saphenous vein was also interrogated. Spectral Doppler was utilized to evaluate flow at rest and with distal augmentation maneuvers in the common femoral, femoral and popliteal veins. COMPARISON:  None. FINDINGS: Contralateral Common Femoral Vein: Respiratory phasicity is normal and symmetric with the symptomatic side. No evidence of thrombus. Normal compressibility. Common Femoral Vein: Noncompressible with partial  flow consistent with thrombosis. Saphenofemoral Junction: Noncompressible with no flow consistent with occlusive thrombus. Profunda Femoral Vein: Noncompressible with partial flow consistent with thrombosis. Femoral Vein: Noncompressible with no flow consistent with occlusive thrombus. Popliteal Vein: Noncompressible with no flow consistent with occlusive thrombus. Calf Veins: Noncompressible with no flow consistent with occlusive thrombus. Venous Reflux:  None. Other Findings:  None. IMPRESSION: Acute deep venous thrombosis is seen involving the left common femoral, profunda femoral, superficial femoral, popliteal and calf veins. Electronically Signed   By: James  Green Jr, M.D.   On: 09/21/2017 15:11   Dg Foot Complete Left  Result Date: 10/05/2017 CLINICAL DATA:  Pain and swelling EXAM: LEFT FOOT - COMPLETE 3+ VIEW COMPARISON:  None. FINDINGS: Frontal, oblique, and lateral views were obtained. There is mild generalized soft tissue swelling. Bones are osteoporotic. No evident fracture or dislocation. Joint spaces are unremarkable. No erosive change. There are scattered foci of arterial vascular calcification in the ankle region. IMPRESSION: No fracture or dislocation. Bones osteoporotic. Mild soft tissue swelling. No appreciable joint space narrowing or erosion. Trifurcation arterial vessel atherosclerosis in the ankle region. Electronically Signed   By: William  Woodruff III M.D.   On: 10/05/2017 14:51    Assessment and Plan:   1. New onset afib - in setting of mutliple metabolic abnormalities as described above - soft bp's initially, has improved with 1 L NS bolus in ER significantly - would be hesitant for AV nodal agent initially given initlal bp's. Will start amiodarone in the short term. Monitor LFTs. Likely may be able to d/c this admission once more stable.  - started on heparin for anticoag in setting of afib and DVT - with DVT and acute onset afib, possibility for PE exists as well as  driver of afib.   2. Hypothermia/Leukocytosis - workup per primary team, consideration for possible sepsis  3. Elevated troponin - in setting of multiple metabolic abnormalities. Unclear if due to global hypoperfusion from hypotension and possible sepsis or  primary coronary event. Patient is not able to give any clear history.  - would work to optimize her acute medical issues. Continue anticoagulation with echo for tomorrow. Pending progress of her other medical issues could consider ischemic testing once more stabilized.  - once again PE also remains a possibility given her extensive DVT. Already on anticoag. F/u echo results including eval of RV function, pending renal function may warrant CT PE, though would not change therapy would likely remove need for cardiac ischemic workup. - continue to cycle cardiac enzymes and EKGs.   4. DVT - acute extensive  left common femoral, profunda femoral, superficial femoral, popliteral, and calf vein DVT - anticoagulation with   heparin - perhaps caused by her being down at home for unknown duration of time.    5. AKI - Cr up to 1.83 (0.99 11 months ago) and BUN to 77 - receiving IVFs in ER. Perhaps hypoperfusion injury  6. Elevated LFTs - mild elvevation, elevated alk phos and tibil - perhaps hypoperfusion injury vs congestive hepatopathy - follow trends  7. Edema/probable CHF - LE edema, crackels on exam, elevated JVD, elevated BNP - initially hypotensive, bp responded very well to IVFs. Oxygenation is stable, at this time would avoid aggressive diuresis pending blood pressure and renal function. F/u echo results.   8. Anemia - chronic, given her elevated troponin would consider transfusion of at least 1 unit with close monitoring of her volume status.    Reasonable to transfer to medicine at [facility] given multiple severe medical issues and need for cardiology coverage over the weekend.    For questions or updates, please contact [facility] Please consult [email or web address] for contact info under Cardiology/STEMI.   Signed, Branch, Jonathan, MD  10/03/2017 4:36 PM  

## 2017-09-18 NOTE — ED Notes (Signed)
Patient seen by adult protective services.  [place] [phone]  Ext [phone]

## 2017-09-18 NOTE — Progress Notes (Signed)
ANTICOAGULATION CONSULT NOTE - Initial Consult  Pharmacy Consult for Heparin Indication: DVT / Afib   No Known Allergies  Patient Measurements: Height: 5\' 5"  (165.1 cm) Weight: 107 lb (48.5 kg) IBW/kg (Calculated) : 57 Heparin Dosing Weight: 48 kg   Vital Signs: Temp: 97.5 F (36.4 C) (11/02 2041) Temp Source: Temporal (11/02 2041) BP: 139/87 (11/02 2041) Pulse Rate: 109 (11/02 2041)  Labs:  Recent Labs  09/29/2017 1352 09/17/2017 1600 09/23/2017 1604  HGB 8.1*  --   --   HCT 29.5*  --   --   PLT 200  --   --   APTT  --   --  32  LABPROT 22.4*  --   --   INR 1.99  --   --   CREATININE 1.83*  --   --   CKTOTAL  --  111  --   TROPONINI 4.90*  --   --     Estimated Creatinine Clearance: 17.8 mL/min (A) (by C-G formula based on SCr of 1.83 mg/dL (H)).   Medical History: Past Medical History:  Diagnosis Date  . Allergic rhinitis   . Anemia, iron deficiency   . Cholecystitis chronic   . COPD (chronic obstructive pulmonary disease) ([place])   . Depression   . Dysuria   . Hyperlipidemia   . Hypothyroidism   . NASH (nonalcoholic steatohepatitis)   . Numbness of foot    Bilateral   . Renal mass   . Right forearm fracture   . Thyroid cancer ([place])   . Uncontrolled stage 2 hypertension     Medications:  Prescriptions Prior to Admission  Medication Sig Dispense Refill Last Dose  . acetaminophen (TYLENOL) 500 MG tablet Take 1 tablet (500 mg total) by mouth every 6 (six) hours as needed for mild pain. 30 tablet 1 Taking  . atorvastatin (LIPITOR) 10 MG tablet Take 1 tablet (10 mg total) by mouth daily. 90 tablet 1   . enalapril (VASOTEC) 20 MG tablet 2 tabs daily 180 tablet 0   . ergocalciferol (VITAMIN D2) 50000 units capsule Take 1 capsule (50,000 Units total) by mouth once a week. One capsule once weekly 12 capsule 1   . ezetimibe (ZETIA) 10 MG tablet Take 1 tablet (10 mg total) by mouth at bedtime. 90 tablet 1   . furosemide (LASIX) 20 MG tablet One tablet once daily ,  as needed, for leg swelling, do not use more than 3 tablets in any one week 20 tablet 0 Taking  . hydrochlorothiazide (HYDRODIURIL) 25 MG tablet Take 1 tablet (25 mg total) by mouth every morning. 90 tablet 0   . hydrOXYzine (ATARAX/VISTARIL) 10 MG tablet One tablet at bedtime ,a s needed, for itching 30 tablet 0 Taking  . levothyroxine (SYNTHROID) 50 MCG tablet Half tablet daily before breakfast every Monday, Wednesday, Friday and Sunday 10 tablet 4   . montelukast (SINGULAIR) 10 MG tablet Take 1 tablet (10 mg total) by mouth at bedtime. 90 tablet 1   . Multiple Vitamin (MULTIVITAMIN WITH MINERALS) TABS tablet Take 1 tablet by mouth daily.   Taking  . NIFEdipine (PROCARDIA-XL/ADALAT CC) 30 MG 24 hr tablet Take 1 tablet (30 mg total) by mouth daily. 90 tablet 0   . omeprazole (PRILOSEC) 40 MG capsule Take 1 capsule (40 mg total) by mouth daily. 90 capsule 0   . tobramycin (TOBREX) 0.3 % ophthalmic solution Place 2 drops into the left eye every 4 (four) hours. 5 mL 0 Taking  . traMADol (  ULTRAM) 50 MG tablet Take 1 tablet (50 mg total) by mouth every 12 (twelve) hours as needed. 30 tablet 2     Assessment: 89 YOF transferred from [facility] emergency department on IV heparin at 800 units/hr for new DVT and Afib. Patient also received a heparin bolus of 4000 units IV once. H/H low, Plt wnl. Patient's INR on admission is elevated at 1.99 but she is not on warfarin at home.   Goal of Therapy:  Heparin level 0.3-0.7 units/ml Monitor platelets by anticoagulation protocol: Yes   Plan:  -Continue IV heparin at 800 units/hr. Would avoid heparin boluses moving forward given elevated INR  -F/u 8 hr HL -Monitor daily HL, CBC and s/s of bleeding   Tina Goodwin, PharmD., BCPS Clinical Pharmacist Pager [phone]

## 2017-09-18 NOTE — ED Notes (Signed)
Pt returned from CT °

## 2017-09-19 ENCOUNTER — Inpatient Hospital Stay (HOSPITAL_COMMUNITY): Payer: Medicare Other

## 2017-09-19 DIAGNOSIS — I82402 Acute embolism and thrombosis of unspecified deep veins of left lower extremity: Secondary | ICD-10-CM

## 2017-09-19 DIAGNOSIS — D72829 Elevated white blood cell count, unspecified: Secondary | ICD-10-CM

## 2017-09-19 DIAGNOSIS — I340 Nonrheumatic mitral (valve) insufficiency: Secondary | ICD-10-CM

## 2017-09-19 DIAGNOSIS — I214 Non-ST elevation (NSTEMI) myocardial infarction: Principal | ICD-10-CM

## 2017-09-19 LAB — TYPE AND SCREEN
ABO/RH(D): O POS
ANTIBODY SCREEN: NEGATIVE
UNIT DIVISION: 0

## 2017-09-19 LAB — BASIC METABOLIC PANEL
Anion gap: 14 (ref 5–15)
BUN: 78 mg/dL — AB (ref 6–20)
CHLORIDE: 117 mmol/L — AB (ref 101–111)
CO2: 11 mmol/L — AB (ref 22–32)
Calcium: 9 mg/dL (ref 8.9–10.3)
POTASSIUM: 4.5 mmol/L (ref 3.5–5.1)
Sodium: 142 mmol/L (ref 135–145)

## 2017-09-19 LAB — TROPONIN I
TROPONIN I: 6.12 ng/mL — AB (ref <0.03)
Troponin I: 8.04 ng/mL (ref <0.03)
Troponin I: 12.82 ng/mL (ref <0.03)

## 2017-09-19 LAB — CBC
HEMATOCRIT: 31.2 % — AB (ref 36.0–46.0)
HEMOGLOBIN: 9 g/dL — AB (ref 12.0–15.0)
MCH: 21 pg — ABNORMAL LOW (ref 26.0–34.0)
MCHC: 28.8 g/dL — AB (ref 30.0–36.0)
MCV: 72.7 fL — AB (ref 78.0–100.0)
Platelets: 153 10*3/uL (ref 150–400)
RBC: 4.29 MIL/uL (ref 3.87–5.11)
RDW: 19.7 % — AB (ref 11.5–15.5)
WBC: 14.7 10*3/uL — ABNORMAL HIGH (ref 4.0–10.5)

## 2017-09-19 LAB — LACTIC ACID, PLASMA: LACTIC ACID, VENOUS: 2.6 mmol/L — AB (ref 0.5–1.9)

## 2017-09-19 LAB — ECHOCARDIOGRAM COMPLETE
Height: 65 in
Weight: 1696.66 oz

## 2017-09-19 LAB — HEPARIN LEVEL (UNFRACTIONATED)
HEPARIN UNFRACTIONATED: 0.39 [IU]/mL (ref 0.30–0.70)
Heparin Unfractionated: 0.19 IU/mL — ABNORMAL LOW (ref 0.30–0.70)

## 2017-09-19 LAB — BPAM RBC
Blood Product Expiration Date: 201811292359
ISSUE DATE / TIME: 201811022014
UNIT TYPE AND RH: 5100

## 2017-09-19 LAB — TSH
TSH: 2.237 u[IU]/mL (ref 0.350–4.500)
TSH: 2.208 u[IU]/mL (ref 0.350–4.500)

## 2017-09-19 LAB — CK: Total CK: 193 U/L (ref 38–234)

## 2017-09-19 LAB — MRSA PCR SCREENING: MRSA BY PCR: NEGATIVE

## 2017-09-19 MED ORDER — METOPROLOL TARTRATE 25 MG PO TABS
25.0000 mg | ORAL_TABLET | Freq: Two times a day (BID) | ORAL | Status: DC
  Administered 2017-09-19: 25 mg via ORAL
  Filled 2017-09-19 (×2): qty 1

## 2017-09-19 MED ORDER — INFLUENZA VAC SPLIT HIGH-DOSE 0.5 ML IM SUSY
0.5000 mL | PREFILLED_SYRINGE | INTRAMUSCULAR | Status: DC
  Filled 2017-09-19: qty 0.5

## 2017-09-19 MED ORDER — MORPHINE SULFATE (PF) 2 MG/ML IV SOLN
2.0000 mg | INTRAVENOUS | Status: DC | PRN
  Administered 2017-09-19 – 2017-09-20 (×4): 2 mg via INTRAVENOUS
  Filled 2017-09-19 (×4): qty 1

## 2017-09-19 MED ORDER — ASPIRIN 81 MG PO CHEW
81.0000 mg | CHEWABLE_TABLET | Freq: Once | ORAL | Status: AC
  Administered 2017-09-19: 81 mg via ORAL
  Filled 2017-09-19: qty 1

## 2017-09-19 MED ORDER — LEVOTHYROXINE SODIUM 100 MCG IV SOLR
25.0000 ug | Freq: Every day | INTRAVENOUS | Status: DC
  Administered 2017-09-20: 25 ug via INTRAVENOUS
  Filled 2017-09-19: qty 5

## 2017-09-19 MED ORDER — NITROGLYCERIN IN D5W 200-5 MCG/ML-% IV SOLN: 0.0000 ug/min | INTRAVENOUS | Status: DC

## 2017-09-19 NOTE — ED Notes (Signed)
Per Pamala Hurry at [employer] met them at the truck and took pt belongings.

## 2017-09-19 NOTE — ED Notes (Signed)
RN called pt daughter at number provided in chart and left message that per [facility] pt belongings were given to neighbor.

## 2017-09-19 NOTE — ED Notes (Signed)
Pt daughter to [facility] asking for pt belongings. Pt belonging storage room/EMS room in [facility] checked and [facility] checked for belongings-none noted.  RN called [facility], spoke with Loree Fee, who stated there were no belongings there with pt. RN called transferring RN-Per Armanda Heritage RN "at 2045 on 10/16/2017 Pt had keys and clothing at beside in belonging bag. [facility] picked up belonging bad and asked pt neighbor who was in room 3 with pt in [facility] if the belongings should go with her or with the pt. The neighbor stated the belongings should go with the pt because the pt's daughter would meet her at [facility]. Items placed on [facility] stretcher." RN called [facility], spoke with Marden Noble, who stated no belongings where seen on truck and no belongings were charted. Marden Noble states he will ask the transporting nurse who comes in at 3pm if she recalls placement of belongings. RN returned to [place] waiting room to update pt daughter and ask for time to speak with [facility] staff as well as to suggest calling neighbor who was at the bedside and provide pt daughter with number for pt experience to follow up, but daughter was not in waiting room. RN told registration clerk Deena to call her if pt daughter returns.

## 2017-09-19 NOTE — Progress Notes (Signed)
Initial Nutrition Assessment  DOCUMENTATION CODES:   Severe malnutrition in context of chronic illness  INTERVENTION:   Monitor for diet advancement, GOC  NUTRITION DIAGNOSIS:   Severe Malnutrition related to chronic illness as evidenced by severe fat depletion, severe muscle depletion.   GOAL:   Patient will meet greater than or equal to 90% of their needs  MONITOR:   I & O's, Labs, Supplement acceptance, PO intake, Weight trends  REASON FOR ASSESSMENT:   Malnutrition Screening Tool    ASSESSMENT:   Tina Goodwin has a PMH of HTN, Diabetes, COPD,  HLD, NASH, Depression, presents with acute encephalopathy, NSTEMI, Anasarca  Spoke with patient's daughter at bedside. Patient has AMS, unable to provide any history. Per daughter, she has 1 tooth and gums foods but does not normally eat soft foods. Reports large weight loss but she is not sure of patient's UBW. Completed NFPE. Monitor for needs.  Labs reviewed  Medications reviewed and include:  NS at 75mL/hr   NUTRITION - FOCUSED PHYSICAL EXAM:    Most Recent Value  Orbital Region  Severe depletion  Upper Arm Region  Severe depletion  Thoracic and Lumbar Region  Severe depletion  Buccal Region  Severe depletion  Temple Region  Severe depletion  Clavicle Bone Region  Severe depletion  Clavicle and Acromion Bone Region  Severe depletion  Scapular Bone Region  Severe depletion  Dorsal Hand  Severe depletion  Patellar Region  Severe depletion  Anterior Thigh Region  Severe depletion  Posterior Calf Region  Severe depletion  Edema (RD Assessment)  None  Hair  Reviewed  Eyes  Reviewed  Mouth  Reviewed  Skin  Reviewed  Nails  Reviewed       Diet Order:  Diet NPO time specified  EDUCATION NEEDS:   Not appropriate for education at this time  Skin:  Skin Assessment: Reviewed RN Assessment  Last BM:  09/16/2017  Height:   Ht Readings from Last 1 Encounters:  09/29/2017 5\' 5"  (1.651 m)    Weight:   Wt  Readings from Last 1 Encounters:  10/10/2017 106 lb 0.7 oz (48.1 kg)    Ideal Body Weight:  56.81 kg  BMI:  Body mass index is 17.65 kg/m.  Estimated Nutritional Needs:   Kcal:  1250-1500 calories  Protein:  72-82 grams (1.5-1.7g/kg)  Fluid:  >1.5L  Satira Anis. Porche Steinberger, MS, RD LDN Inpatient Clinical Dietitian Pager [phone]

## 2017-09-19 NOTE — Progress Notes (Signed)
PROGRESS NOTE    ROSEY EIDE  OFB:[id number] DOB: [birth year] DOA: 10/06/2017 PCP: Fayrene Helper, MD   Brief Narrative: KERIGAN NARVAEZ is a 83 y.o. female she is unable to give any history of present, per chart has a history of hypertension, diabetes, COPD, chronic anemia. She presented with acute encephalopathy and found to have an NSTEMI.    Assessment & Plan:   Principal Problem:   NSTEMI (non-ST elevated myocardial infarction) ([place]) Active Problems:   Atrial fibrillation with RVR ([facility])   Anemia   Leukocytosis   ARF (acute renal failure) ([place])   NSTEMI Patient with rising troponin and significant chest pain. Mildly tachycardic. -cardiology recommendations: heparin -add morphine prn  Hypothermia Leukocytosis No evidence of infection. Urine/blood cultures pending. -warming blanket  Atrial fibrillation with RVR New onset. Started on amiodarone per cardiology -cardiology recommendations  Acute left lower extremity DVT Started on heparin drip -continue heparin  Acute kidney injury Likely secondary to hypoperfusion.  -repeat BMP  Anemia Stable.  Transaminitis Possibly secondary to hypoperfusion -repeat CMP  Anasarca Appears improved.   DVT prophylaxis: Heparin IV Code Status: Full code Family Communication: Attempted to call Daughter without success via telephone Disposition Plan: Discharge pending cardiology recommendations   Consultants:   Cardiology  Procedures:   None  Antimicrobials:  None    Subjective: Significant substernal chest pain. She is unable to quantify it  Objective: Vitals:   09/24/2017 2200 09/26/2017 2231 09/21/2017 2330 09/19/17 0354  BP: (!) 148/92  (!) 151/137 (!) 137/98  Pulse: (!) 41  (!) 44 (!) 106  Resp: (!) 23  16 18   Temp: (!) 96.8 F (36 C)  (!) 96.8 F (36 C) (!) 97.4 F (36.3 C)  TempSrc: Axillary  Axillary Oral  SpO2: 97%  98% 100%  Weight:  48.1 kg (106 lb 0.7 oz)    Height:  5\' 5"  (1.651  m)      Intake/Output Summary (Last 24 hours) at 09/19/17 0714 Last data filed at 09/19/17 0422  Gross per 24 hour  Intake          1531.52 ml  Output              600 ml  Net           931.52 ml   Filed Weights   10/15/2017 1327 09/27/2017 2231  Weight: 48.5 kg (107 lb) 48.1 kg (106 lb 0.7 oz)    Examination:  General exam: Appears calm and comfortable Respiratory system: Clear to auscultation. Respiratory effort normal. Cardiovascular system: S1 & S2 heard, irregular rhythm, normal rate. No murmurs, rubs, gallops or clicks. Gastrointestinal system: Abdomen is nondistended, soft and nontender. No organomegaly or masses felt. Normal bowel sounds heard. Central nervous system: Alert and oriented to person and place. No focal neurological deficits. Extremities: No calf tenderness Skin: No cyanosis. No rashes Psychiatry: Judgement and insight appear impaired. Mood & affect depressed and flat.     Data Reviewed: I have personally reviewed following labs and imaging studies  CBC:  Recent Labs Lab 10/08/2017 1352 09/19/17 0333  WBC 12.4* 14.7*  NEUTROABS 11.5*  --   HGB 8.1* 9.0*  HCT 29.5* 31.2*  MCV 71.8* 72.7*  PLT 200 782   Basic Metabolic Panel:  Recent Labs Lab 10/10/2017 1352 09/19/17 0201  NA 143 142  K 4.5 4.5  CL 111 117*  CO2 18* 11*  GLUCOSE 111* NOT CALCULATED  BUN 77* 78*  CREATININE 1.83* NOT CALCULATED  CALCIUM  9.5 9.0   GFR: CrCl cannot be calculated (This lab value cannot be used to calculate CrCl because it is not a number: NOT CALCULATED). Liver Function Tests:  Recent Labs Lab 09/29/2017 1352  AST 71*  ALT 77*  ALKPHOS 201*  BILITOT 1.5*  PROT 5.9*  ALBUMIN 3.1*    Recent Labs Lab 09/21/2017 1352  LIPASE 24    Recent Labs Lab 10/16/2017 1352  AMMONIA 13   Coagulation Profile:  Recent Labs Lab 09/30/2017 1352  INR 1.99   Cardiac Enzymes:  Recent Labs Lab 10/07/2017 1352 10/04/2017 1600 09/19/17 0201  CKTOTAL  --  111  --     TROPONINI 4.90*  --  6.12*   BNP (last 3 results) No results for input(s): PROBNP in the last 8760 hours. HbA1C: No results for input(s): HGBA1C in the last 72 hours. CBG: No results for input(s): GLUCAP in the last 168 hours. Lipid Profile: No results for input(s): CHOL, HDL, LDLCALC, TRIG, CHOLHDL, LDLDIRECT in the last 72 hours. Thyroid Function Tests:  Recent Labs  10/11/2017 1352 09/19/17 0201  TSH 1.893 2.208  FREET4 1.03  --    Anemia Panel: No results for input(s): VITAMINB12, FOLATE, FERRITIN, TIBC, IRON, RETICCTPCT in the last 72 hours. Sepsis Labs:  Recent Labs Lab 10/05/2017 1443  LATICACIDVEN 2.68*    No results found for this or any previous visit (from the past 240 hour(s)).       Radiology Studies: Ct Abdomen Pelvis Wo Contrast  Result Date: 10/08/2017 CLINICAL DATA:  Initial evaluation for acute altered mental status. Abdominal pain. EXAM: CT ABDOMEN AND PELVIS WITHOUT CONTRAST TECHNIQUE: Multidetector CT imaging of the abdomen and pelvis was performed following the standard protocol without IV contrast. COMPARISON:  Prior CT from 05/22/2014. FINDINGS: Lower chest: Moderate bilateral pleural effusions, right slightly larger than left. Associated bibasilar atelectasis. Moderate cardiomegaly. Decreased density within the cardiac blood pool suggests anemia. Three vessel coronary artery calcifications. Trace pericardial effusion. Hepatobiliary: Limited noncontrast evaluation of the liver is grossly unremarkable. Few punctate granulomas noted within the left hepatic lobe. Cholelithiasis. No findings to suggest acute cholecystitis on this noncontrast examination. No appreciable biliary dilatation. Pancreas: Pancreas grossly stable in appearance without acute abnormality. Previously seen cystic lucency within the pancreatic body not well delineated on this noncontrast examination. Irregular pancreatic ductal dilatation grossly stable. No acute abnormality about the  pancreas. Spleen: Spleen within normal limits. Adrenals/Urinary Tract: Adrenal glands unremarkable. Kidneys fairly equal in size. Nonobstructive calculi measuring up to 5 mm present within the lower pole of the right kidney. Few scattered hyperdense lesions within the bilateral kidneys, largest of which positioned within the interpolar left kidney measure up to 11 mm. While these are indeterminate, findings favored to reflect a small proteinaceous and/ or hemorrhagic cyst. These are new from prior exam. Additional simple right renal cyst noted. No appreciable hydroureter. Bladder moderately distended without acute abnormality. Stomach/Bowel: Stomach within normal limits. No evidence for bowel obstruction. Colonic diverticulosis without evidence for acute diverticulitis. No definite acute inflammatory changes seen about the bowels. Vascular/Lymphatic: Extensive atherosclerosis throughout the intra- abdominal aorta and its branch vessels. No aneurysm. No appreciable adenopathy identified on this noncontrast examination. Reproductive: Uterus not well visualized, and may be absent. No adnexal mass. Ovaries not discretely identified. Other: No free intraperitoneal air. Diffuse mesenteric edema without frank free fluid, suspected to be related to volume status. Musculoskeletal: Diffuse anasarca noted. The no acute osseus abnormality. No worrisome lytic or blastic osseous lesions. Thoracolumbar scoliosis noted. IMPRESSION:  1. Cardiomegaly with moderate bilateral pleural effusions, mesenteric edema, and diffuse anasarca. Findings suspected to be related to underlying cardiac dysfunction. 2. No other acute intra-abdominal or pelvic process identified. 3. Advanced atherosclerosis with diffuse 3 vessel coronary artery calcifications. 4. Colonic diverticulosis without evidence for acute diverticulitis. 5. Cholelithiasis. 6. Nonobstructive right renal nephrolithiasis. 7. Scattered bilateral hyperdense renal lesions,  indeterminate, but new from 2015. While these are suspected to reflect proteinaceous and/or hemorrhagic cysts, these are not well evaluated on this noncontrast examination. Further evaluation with renal mass protocol CT suggested for full characterization. This would be most appropriately performed on a nonemergent outpatient basis. Electronically Signed   By: Jeannine Boga M.D.   On: 09/21/2017 16:21   Dg Chest 1 View  Result Date: 09/24/2017 CLINICAL DATA:  Altered mental status EXAM: CHEST 1 VIEW COMPARISON:  May 22, 2014 FINDINGS: There is a small left pleural effusion. There is a minimal right pleural effusion. There is patchy atelectasis in the left base. Lungs elsewhere clear. Heart size and pulmonary vascularity are normal. No adenopathy. There is aortic atherosclerosis. No evident bone lesions. IMPRESSION: Small pleural effusions bilaterally, left larger than right. Atelectatic change left base. Lungs elsewhere clear. Stable cardiac silhouette. There is aortic atherosclerosis. Aortic Atherosclerosis (ICD10-I70.0). Electronically Signed   By: Lowella Grip III M.D.   On: 10/11/2017 14:48   Ct Head Wo Contrast  Result Date: 09/21/2017 CLINICAL DATA:  Altered level of consciousness EXAM: CT HEAD WITHOUT CONTRAST TECHNIQUE: Contiguous axial images were obtained from the base of the skull through the vertex without intravenous contrast. COMPARISON:  None. FINDINGS: Brain: No evidence of acute infarction, hemorrhage, hydrocephalus, extra-axial collection or mass lesion/mass effect. Cortical atrophy asymmetric to the right, most convincing at the level of the temporal lobe. Mild cerebellar atrophy. Mild chronic small vessel ischemia. Vascular: Atherosclerotic calcification. Peripherally calcified structure along the right cavernous carotid, favor aneurysm over tortuous vessel based on reformats Skull: Heterogeneous density likely from osteopenia. No acute or focal aggressive finding.  Sinuses/Orbits: Negative IMPRESSION: 1. No acute finding. 2. Cortical atrophy asymmetrically involving the right temporal lobe. 3. Probable 8 mm right cavernous ICA aneurysm. Electronically Signed   By: Monte Fantasia M.D.   On: 10/10/2017 16:00   US Venous Img Lower Unilateral Left  Result Date: 10/03/2017 CLINICAL DATA:  Weakness. EXAM: Left LOWER EXTREMITY VENOUS DOPPLER ULTRASOUND TECHNIQUE: Gray-scale sonography with graded compression, as well as color Doppler and duplex ultrasound were performed to evaluate the lower extremity deep venous systems from the level of the common femoral vein and including the common femoral, femoral, profunda femoral, popliteal and calf veins including the posterior tibial, peroneal and gastrocnemius veins when visible. The superficial great saphenous vein was also interrogated. Spectral Doppler was utilized to evaluate flow at rest and with distal augmentation maneuvers in the common femoral, femoral and popliteal veins. COMPARISON:  None. FINDINGS: Contralateral Common Femoral Vein: Respiratory phasicity is normal and symmetric with the symptomatic side. No evidence of thrombus. Normal compressibility. Common Femoral Vein: Noncompressible with partial flow consistent with thrombosis. Saphenofemoral Junction: Noncompressible with no flow consistent with occlusive thrombus. Profunda Femoral Vein: Noncompressible with partial flow consistent with thrombosis. Femoral Vein: Noncompressible with no flow consistent with occlusive thrombus. Popliteal Vein: Noncompressible with no flow consistent with occlusive thrombus. Calf Veins: Noncompressible with no flow consistent with occlusive thrombus. Venous Reflux:  None. Other Findings:  None. IMPRESSION: Acute deep venous thrombosis is seen involving the left common femoral, profunda femoral, superficial femoral, popliteal and calf  veins. Electronically Signed   By: Marijo Conception, M.D.   On: 10/10/2017 15:11   Dg Foot Complete  Left  Result Date: 10/01/2017 CLINICAL DATA:  Pain and swelling EXAM: LEFT FOOT - COMPLETE 3+ VIEW COMPARISON:  None. FINDINGS: Frontal, oblique, and lateral views were obtained. There is mild generalized soft tissue swelling. Bones are osteoporotic. No evident fracture or dislocation. Joint spaces are unremarkable. No erosive change. There are scattered foci of arterial vascular calcification in the ankle region. IMPRESSION: No fracture or dislocation. Bones osteoporotic. Mild soft tissue swelling. No appreciable joint space narrowing or erosion. Trifurcation arterial vessel atherosclerosis in the ankle region. Electronically Signed   By: Lowella Grip III M.D.   On: 09/21/2017 14:51        Scheduled Meds: . atorvastatin  10 mg Oral Daily  . ezetimibe  10 mg Oral QHS  . levothyroxine  50 mcg Oral QAC breakfast  . mouth rinse  15 mL Mouth Rinse BID  . sodium chloride flush  3 mL Intravenous Q12H   Continuous Infusions: . sodium chloride    . sodium chloride 75 mL/hr at 09/19/17 0422  . amiodarone 30 mg/hr (09/19/17 0422)  . heparin 1,000 Units/hr (09/19/17 0422)     LOS: 1 day     Cordelia Poche, MD [facility] 09/19/2017, 7:14 AM Pager: [phone]  If 7PM-7AM, please contact night-coverage [email or web address] Password [email or web address] 09/19/2017, 7:14 AM

## 2017-09-19 NOTE — Progress Notes (Signed)
Pt is very sleepy this evening. VS stable. Resting comfortably.

## 2017-09-19 NOTE — Progress Notes (Signed)
ANTICOAGULATION CONSULT NOTE - Follow Up Consult  Pharmacy Consult for heparin Indication: Afib and new DVT  Labs:  Recent Labs  10/08/2017 1352 09/21/2017 1600 09/24/2017 1604 09/19/17 0201 09/19/17 0333  HGB 8.1*  --   --   --  9.0*  HCT 29.5*  --   --   --  31.2*  PLT 200  --   --   --  153  APTT  --   --  32  --   --   LABPROT 22.4*  --   --   --   --   INR 1.99  --   --   --   --   HEPARINUNFRC  --   --   --  <0.10*  --   CREATININE 1.83*  --   --  NOT CALCULATED  --   CKTOTAL  --  111  --   --   --   TROPONINI 4.90*  --   --  6.12*  --     Assessment: 83yo female undetectable on heparin with initial dosing for Afib and new DVT; baseline INR 1.99.  Goal of Therapy:  Heparin level 0.3-0.7 units/ml   Plan:  Will increase heparin gtt by 4 units/kg/hr to 1000 units/hr and check level in [place], PharmD, BCPS  09/19/2017,4:19 AM

## 2017-09-19 NOTE — Progress Notes (Signed)
ANTICOAGULATION CONSULT NOTE - Follow Up Consult  Pharmacy Consult for heparin Indication: Afib and new DVT  Labs:  Recent Labs  10/13/2017 1352 10/16/2017 1600 10/06/2017 1604 09/19/17 0201 09/19/17 0333 09/19/17 0951 09/19/17 1201 09/19/17 1747 09/19/17 2309  HGB 8.1*  --   --   --  9.0*  --   --   --   --   HCT 29.5*  --   --   --  31.2*  --   --   --   --   PLT 200  --   --   --  153  --   --   --   --   APTT  --   --  32  --   --   --   --   --   --   LABPROT 22.4*  --   --   --   --   --   --   --   --   INR 1.99  --   --   --   --   --   --   --   --   HEPARINUNFRC  --   --   --  <0.10*  --   --  0.19*  --  0.39  CREATININE 1.83*  --   --  NOT CALCULATED  --   --   --   --   --   CKTOTAL  --  111  --   --   --   --  193  --   --   TROPONINI 4.90*  --   --  6.12*  --  8.04*  --  12.82*  --     Assessment: 83yo female now therapeutic on heparin after rate changes though at lower end of goal.  Goal of Therapy:  Heparin level 0.3-0.7 units/ml   Plan:  Will increase heparin gtt slightly 1200 units/hr and check level in with am labs.  Wynona Neat, PharmD, BCPS  09/19/2017,11:42 PM

## 2017-09-19 NOTE — Progress Notes (Signed)
CRITICAL VALUE ALERT  Critical Value: Troponin 6.12  Date & Time Notied:  09/19/17 0335  Provider Notified: X. Blount  Orders Received/Actions taken: Awaiting

## 2017-09-19 NOTE — Progress Notes (Signed)
ANTICOAGULATION CONSULT NOTE - Follow Up Consult  Pharmacy Consult for heparin Indication: Afib and new DVT  Labs:  Recent Labs  09/17/2017 1352 10/09/2017 1600 10/03/2017 1604 09/19/17 0201 09/19/17 0333 09/19/17 0951 09/19/17 1201  HGB 8.1*  --   --   --  9.0*  --   --   HCT 29.5*  --   --   --  31.2*  --   --   PLT 200  --   --   --  153  --   --   APTT  --   --  32  --   --   --   --   LABPROT 22.4*  --   --   --   --   --   --   INR 1.99  --   --   --   --   --   --   HEPARINUNFRC  --   --   --  <0.10*  --   --  0.19*  CREATININE 1.83*  --   --  NOT CALCULATED  --   --   --   CKTOTAL  --  111  --   --   --   --  193  TROPONINI 4.90*  --   --  6.12*  --  8.04*  --     Assessment: 85 yoF on IV heparin for new DVT and new onset AFib. Heparin level subtherapeutic at 0.19.  Goal of Therapy:  Heparin level 0.3-0.7 units/ml   Plan:  -Increase heparin to 1150 units/hr -Check 8-hr heparin level -Daily heparin level, CBC, S/Sx bleeding  Arrie Senate, PharmD PGY-2 Cardiology Pharmacy Resident Pager: [phone] 09/19/2017

## 2017-09-19 NOTE — Progress Notes (Signed)
Pt converted back to NSR. Noted at 2308. Will continue to monitor.

## 2017-09-19 NOTE — Progress Notes (Signed)
Pt arrived to unit. Pt resting comfortably.

## 2017-09-19 NOTE — Progress Notes (Signed)
Progress Note  Patient Name: Tina Goodwin Date of Encounter: 09/19/2017  Primary Cardiologist: Dr Harl Bowie  Subjective   A and O x person only; mild dyspnea; "hurt all over"  Inpatient Medications    Scheduled Meds: . atorvastatin  10 mg Oral Daily  . ezetimibe  10 mg Oral QHS  . levothyroxine  25 mcg Intravenous Daily  . mouth rinse  15 mL Mouth Rinse BID  . sodium chloride flush  3 mL Intravenous Q12H   Continuous Infusions: . sodium chloride    . sodium chloride 75 mL/hr at 09/19/17 0422  . amiodarone 30 mg/hr (09/19/17 0422)  . heparin 1,000 Units/hr (09/19/17 0422)   PRN Meds: sodium chloride, acetaminophen **OR** acetaminophen, morphine injection, ondansetron **OR** ondansetron (ZOFRAN) IV, sodium chloride flush   Vital Signs    Vitals:   09/21/2017 2231 10/10/2017 2330 09/19/17 0354 09/19/17 0800  BP:  (!) 151/137 (!) 137/98 129/81  Pulse:  (!) 44 (!) 106   Resp:  16 18 (!) 24  Temp:  (!) 96.8 F (36 C) (!) 97.4 F (36.3 C) (!) 97.3 F (36.3 C)  TempSrc:  Axillary Oral Oral  SpO2:  98% 100% 98%  Weight: 106 lb 0.7 oz (48.1 kg)     Height: 5\' 5"  (1.651 m)       Intake/Output Summary (Last 24 hours) at 09/19/17 1118 Last data filed at 09/19/17 0422  Gross per 24 hour  Intake          1531.52 ml  Output              600 ml  Net           931.52 ml   Filed Weights   10/05/2017 1327 09/24/2017 2231  Weight: 107 lb (48.5 kg) 106 lb 0.7 oz (48.1 kg)    Telemetry    Atrial fibrillation- Personally Reviewed   Physical Exam   GEN: WD frail No acute distress.   Neck: supple Cardiac: irregular Respiratory: Mildly diminished BS bases GI: Soft, mild diffuse tenderness MS: 1+ edema Neuro:  moves all ext; 1+ edema   Labs    Chemistry Recent Labs Lab 10/03/2017 1352 09/19/17 0201  NA 143 142  K 4.5 4.5  CL 111 117*  CO2 18* 11*  GLUCOSE 111* NOT CALCULATED  BUN 77* 78*  CREATININE 1.83* NOT CALCULATED  CALCIUM 9.5 9.0  PROT 5.9*  --   ALBUMIN  3.1*  --   AST 71*  --   ALT 77*  --   ALKPHOS 201*  --   BILITOT 1.5*  --   GFRNONAA 24* NOT CALCULATED  GFRAA 28* NOT CALCULATED  ANIONGAP 14 14     Hematology Recent Labs Lab 10/04/2017 1352 09/19/17 0333  WBC 12.4* 14.7*  RBC 4.11 4.29  HGB 8.1* 9.0*  HCT 29.5* 31.2*  MCV 71.8* 72.7*  MCH 19.7* 21.0*  MCHC 27.5* 28.8*  RDW 18.4* 19.7*  PLT 200 153    Cardiac Enzymes Recent Labs Lab 09/26/2017 1352 09/19/17 0201 09/19/17 0951  TROPONINI 4.90* 6.12* 8.04*    BNP Recent Labs Lab 09/17/2017 1352  BNP 1,061.0*      Radiology    Ct Abdomen Pelvis Wo Contrast  Result Date: 10/16/2017 CLINICAL DATA:  Initial evaluation for acute altered mental status. Abdominal pain. EXAM: CT ABDOMEN AND PELVIS WITHOUT CONTRAST TECHNIQUE: Multidetector CT imaging of the abdomen and pelvis was performed following the standard protocol without IV contrast. COMPARISON:  Prior CT  from 05/22/2014. FINDINGS: Lower chest: Moderate bilateral pleural effusions, right slightly larger than left. Associated bibasilar atelectasis. Moderate cardiomegaly. Decreased density within the cardiac blood pool suggests anemia. Three vessel coronary artery calcifications. Trace pericardial effusion. Hepatobiliary: Limited noncontrast evaluation of the liver is grossly unremarkable. Few punctate granulomas noted within the left hepatic lobe. Cholelithiasis. No findings to suggest acute cholecystitis on this noncontrast examination. No appreciable biliary dilatation. Pancreas: Pancreas grossly stable in appearance without acute abnormality. Previously seen cystic lucency within the pancreatic body not well delineated on this noncontrast examination. Irregular pancreatic ductal dilatation grossly stable. No acute abnormality about the pancreas. Spleen: Spleen within normal limits. Adrenals/Urinary Tract: Adrenal glands unremarkable. Kidneys fairly equal in size. Nonobstructive calculi measuring up to 5 mm present within  the lower pole of the right kidney. Few scattered hyperdense lesions within the bilateral kidneys, largest of which positioned within the interpolar left kidney measure up to 11 mm. While these are indeterminate, findings favored to reflect a small proteinaceous and/ or hemorrhagic cyst. These are new from prior exam. Additional simple right renal cyst noted. No appreciable hydroureter. Bladder moderately distended without acute abnormality. Stomach/Bowel: Stomach within normal limits. No evidence for bowel obstruction. Colonic diverticulosis without evidence for acute diverticulitis. No definite acute inflammatory changes seen about the bowels. Vascular/Lymphatic: Extensive atherosclerosis throughout the intra- abdominal aorta and its branch vessels. No aneurysm. No appreciable adenopathy identified on this noncontrast examination. Reproductive: Uterus not well visualized, and may be absent. No adnexal mass. Ovaries not discretely identified. Other: No free intraperitoneal air. Diffuse mesenteric edema without frank free fluid, suspected to be related to volume status. Musculoskeletal: Diffuse anasarca noted. The no acute osseus abnormality. No worrisome lytic or blastic osseous lesions. Thoracolumbar scoliosis noted. IMPRESSION: 1. Cardiomegaly with moderate bilateral pleural effusions, mesenteric edema, and diffuse anasarca. Findings suspected to be related to underlying cardiac dysfunction. 2. No other acute intra-abdominal or pelvic process identified. 3. Advanced atherosclerosis with diffuse 3 vessel coronary artery calcifications. 4. Colonic diverticulosis without evidence for acute diverticulitis. 5. Cholelithiasis. 6. Nonobstructive right renal nephrolithiasis. 7. Scattered bilateral hyperdense renal lesions, indeterminate, but new from 2015. While these are suspected to reflect proteinaceous and/or hemorrhagic cysts, these are not well evaluated on this noncontrast examination. Further evaluation with  renal mass protocol CT suggested for full characterization. This would be most appropriately performed on a nonemergent outpatient basis. Electronically Signed   By: Jeannine Boga M.D.   On: 10/13/2017 16:21   Dg Chest 1 View  Result Date: 09/26/2017 CLINICAL DATA:  Altered mental status EXAM: CHEST 1 VIEW COMPARISON:  May 22, 2014 FINDINGS: There is a small left pleural effusion. There is a minimal right pleural effusion. There is patchy atelectasis in the left base. Lungs elsewhere clear. Heart size and pulmonary vascularity are normal. No adenopathy. There is aortic atherosclerosis. No evident bone lesions. IMPRESSION: Small pleural effusions bilaterally, left larger than right. Atelectatic change left base. Lungs elsewhere clear. Stable cardiac silhouette. There is aortic atherosclerosis. Aortic Atherosclerosis (ICD10-I70.0). Electronically Signed   By: Lowella Grip III M.D.   On: 10/09/2017 14:48   Ct Head Wo Contrast  Result Date: 10/08/2017 CLINICAL DATA:  Altered level of consciousness EXAM: CT HEAD WITHOUT CONTRAST TECHNIQUE: Contiguous axial images were obtained from the base of the skull through the vertex without intravenous contrast. COMPARISON:  None. FINDINGS: Brain: No evidence of acute infarction, hemorrhage, hydrocephalus, extra-axial collection or mass lesion/mass effect. Cortical atrophy asymmetric to the right, most convincing at the level of  the temporal lobe. Mild cerebellar atrophy. Mild chronic small vessel ischemia. Vascular: Atherosclerotic calcification. Peripherally calcified structure along the right cavernous carotid, favor aneurysm over tortuous vessel based on reformats Skull: Heterogeneous density likely from osteopenia. No acute or focal aggressive finding. Sinuses/Orbits: Negative IMPRESSION: 1. No acute finding. 2. Cortical atrophy asymmetrically involving the right temporal lobe. 3. Probable 8 mm right cavernous ICA aneurysm. Electronically Signed   By:  Monte Fantasia M.D.   On: 10/06/2017 16:00   US Venous Img Lower Unilateral Left  Result Date: 09/26/2017 CLINICAL DATA:  Weakness. EXAM: Left LOWER EXTREMITY VENOUS DOPPLER ULTRASOUND TECHNIQUE: Gray-scale sonography with graded compression, as well as color Doppler and duplex ultrasound were performed to evaluate the lower extremity deep venous systems from the level of the common femoral vein and including the common femoral, femoral, profunda femoral, popliteal and calf veins including the posterior tibial, peroneal and gastrocnemius veins when visible. The superficial great saphenous vein was also interrogated. Spectral Doppler was utilized to evaluate flow at rest and with distal augmentation maneuvers in the common femoral, femoral and popliteal veins. COMPARISON:  None. FINDINGS: Contralateral Common Femoral Vein: Respiratory phasicity is normal and symmetric with the symptomatic side. No evidence of thrombus. Normal compressibility. Common Femoral Vein: Noncompressible with partial flow consistent with thrombosis. Saphenofemoral Junction: Noncompressible with no flow consistent with occlusive thrombus. Profunda Femoral Vein: Noncompressible with partial flow consistent with thrombosis. Femoral Vein: Noncompressible with no flow consistent with occlusive thrombus. Popliteal Vein: Noncompressible with no flow consistent with occlusive thrombus. Calf Veins: Noncompressible with no flow consistent with occlusive thrombus. Venous Reflux:  None. Other Findings:  None. IMPRESSION: Acute deep venous thrombosis is seen involving the left common femoral, profunda femoral, superficial femoral, popliteal and calf veins. Electronically Signed   By: Marijo Conception, M.D.   On: 10/10/2017 15:11   Dg Foot Complete Left  Result Date: 10/04/2017 CLINICAL DATA:  Pain and swelling EXAM: LEFT FOOT - COMPLETE 3+ VIEW COMPARISON:  None. FINDINGS: Frontal, oblique, and lateral views were obtained. There is mild  generalized soft tissue swelling. Bones are osteoporotic. No evident fracture or dislocation. Joint spaces are unremarkable. No erosive change. There are scattered foci of arterial vascular calcification in the ankle region. IMPRESSION: No fracture or dislocation. Bones osteoporotic. Mild soft tissue swelling. No appreciable joint space narrowing or erosion. Trifurcation arterial vessel atherosclerosis in the ankle region. Electronically Signed   By: Lowella Grip III M.D.   On: 09/17/2017 14:51    Patient Profile     83 y.o. female with past medical history of diabetes, hypertension, COPD, anemia, alcohol abuse admitted after she did not answer her door and found lying on her couch in urine. She was noted to have microcytic anemia, acute renal failure, elevated troponin, atrial fibrillation, lower extremity DVT and diffuse anasarca on abdominal CT. Cardiology asked to evaluate.  Assessment & Plan    1 atrial fibrillation-Newly diagnosed atrial fibrillation of unknown duration. I will continue amiodarone for now. Continue IV heparin. CHADS vasc at least 5. Add metoprolol 25 mg twice a day. TSH is normal. Await echocardiogram for LV function. She is noted to have a DVT and certainly a pulmonary embolus could have precipitated her atrial fibrillation. She may need a CT scan to further assess as her renal function improves.  2 non-ST elevation myocardial infarction-troponin is elevated. However this is in the setting of acute renal insufficiency and newly diagnosed atrial fibrillation. It is also unclear how long she was down  at her home. Question contribution from rhabdomyolysis. Check CK. Add aspirin. Continue statin. Continue heparin. Add metoprolol. Await echocardiogram for LV function. At present she is not a good candidate for further ischemia evaluation given multiple comorbidities, frail body habitus and confusion.  3 acute on chronic stage 3 renal failure- Will await echocardiogram for LV  function. If preserved may need gentle hydration.  4 alcohol abuse-patient's grandson states she has significant alcohol abuse. Follow for withdrawal.  5 microcytic anemia-further evaluation per primary care.  6 DVT-continue heparin for now. Would consider CTA to exclude pulmonary embolus once renal function improves.  7 encephalopathy-further evaluation per primary care.  For questions or updates, please contact [place] Please consult [email or web address] for contact info under Cardiology/STEMI.      Signed, Kirk Ruths, MD  09/19/2017, 11:18 AM

## 2017-09-19 NOTE — Progress Notes (Signed)
  Echocardiogram 2D Echocardiogram has been performed.  Bobbye Charleston 09/19/2017, 12:16 PM

## 2017-09-19 NOTE — Plan of Care (Signed)
Problem: Safety: Goal: Ability to remain free from injury will improve Outcome: Progressing Bed in lowest postion with bed alarm on.

## 2017-09-19 NOTE — Plan of Care (Signed)
Problem: Pain Managment: Goal: General experience of comfort will improve Outcome: Progressing Pt is able to rest more comfortably now. Expresses need to be repositioned when uncomfortable.

## 2017-09-20 LAB — CBC
HCT: 32.7 % — ABNORMAL LOW (ref 36.0–46.0)
Hemoglobin: 9.2 g/dL — ABNORMAL LOW (ref 12.0–15.0)
MCH: 20.7 pg — ABNORMAL LOW (ref 26.0–34.0)
MCHC: 28.1 g/dL — ABNORMAL LOW (ref 30.0–36.0)
MCV: 73.5 fL — AB (ref 78.0–100.0)
PLATELETS: 184 10*3/uL (ref 150–400)
RBC: 4.45 MIL/uL (ref 3.87–5.11)
RDW: 19.7 % — AB (ref 11.5–15.5)
WBC: 16.7 10*3/uL — AB (ref 4.0–10.5)

## 2017-09-20 LAB — BASIC METABOLIC PANEL
ANION GAP: 11 (ref 5–15)
BUN: 87 mg/dL — AB (ref 6–20)
CALCIUM: 8.6 mg/dL — AB (ref 8.9–10.3)
CO2: 16 mmol/L — ABNORMAL LOW (ref 22–32)
Chloride: 117 mmol/L — ABNORMAL HIGH (ref 101–111)
Creatinine, Ser: 2.6 mg/dL — ABNORMAL HIGH (ref 0.44–1.00)
GFR calc Af Amer: 18 mL/min — ABNORMAL LOW (ref >60)
GFR, EST NON AFRICAN AMERICAN: 16 mL/min — AB (ref >60)
GLUCOSE: 101 mg/dL — AB (ref 65–99)
Potassium: 4.5 mmol/L (ref 3.5–5.1)
Sodium: 144 mmol/L (ref 135–145)

## 2017-09-20 LAB — PROTIME-INR
INR: 1.74
Prothrombin Time: 20.2 seconds — ABNORMAL HIGH (ref 11.4–15.2)

## 2017-09-20 LAB — TROPONIN I
TROPONIN I: 14.02 ng/mL — AB (ref <0.03)
TROPONIN I: 18.81 ng/mL — AB (ref <0.03)

## 2017-09-20 LAB — GLUCOSE, CAPILLARY: GLUCOSE-CAPILLARY: 93 mg/dL (ref 65–99)

## 2017-09-20 LAB — HEPARIN LEVEL (UNFRACTIONATED): Heparin Unfractionated: 0.47 IU/mL (ref 0.30–0.70)

## 2017-09-20 MED ORDER — HYDRALAZINE HCL 20 MG/ML IJ SOLN: 5.0000 mg | Freq: Three times a day (TID) | INTRAMUSCULAR | Status: DC | PRN

## 2017-09-20 MED ORDER — HYDRALAZINE HCL 10 MG PO TABS: 10.0000 mg | ORAL_TABLET | Freq: Three times a day (TID) | ORAL | Status: DC

## 2017-09-20 MED ORDER — MILRINONE LACTATE IN DEXTROSE 20-5 MG/100ML-% IV SOLN
0.1250 ug/kg/min | INTRAVENOUS | Status: DC
  Administered 2017-09-20: 0.125 ug/kg/min via INTRAVENOUS
  Filled 2017-09-20 (×2): qty 100

## 2017-09-20 MED ORDER — LEVOTHYROXINE SODIUM 100 MCG IV SOLR
12.5000 ug | INTRAVENOUS | Status: DC
  Administered 2017-09-21: 12.5 ug via INTRAVENOUS
  Filled 2017-09-20: qty 5

## 2017-09-20 MED ORDER — CARVEDILOL 3.125 MG PO TABS: 3.1250 mg | ORAL_TABLET | Freq: Two times a day (BID) | ORAL | Status: DC

## 2017-09-20 NOTE — Progress Notes (Signed)
Progress Note  Patient Name: Tina Goodwin Date of Encounter: 09/20/2017  Primary Cardiologist: Dr Harl Bowie  Subjective   Pt does not respond to questions appropriately; opens eyes and moves all ext  Inpatient Medications    Scheduled Meds: . atorvastatin  10 mg Oral Daily  . ezetimibe  10 mg Oral QHS  . Influenza vac split quadrivalent PF  0.5 mL Intramuscular Tomorrow-1000  . levothyroxine  25 mcg Intravenous Daily  . mouth rinse  15 mL Mouth Rinse BID  . metoprolol tartrate  25 mg Oral BID  . sodium chloride flush  3 mL Intravenous Q12H   Continuous Infusions: . sodium chloride    . sodium chloride 75 mL/hr at 09/20/17 0800  . amiodarone 30 mg/hr (09/20/17 0800)  . heparin 1,200 Units/hr (09/20/17 0800)   PRN Meds: sodium chloride, acetaminophen **OR** acetaminophen, morphine injection, ondansetron **OR** ondansetron (ZOFRAN) IV, sodium chloride flush   Vital Signs    Vitals:   09/19/17 2300 09/20/17 0300 09/20/17 0747 09/20/17 0800  BP: 132/73 (!) 146/68    Pulse: 71 81 80 82  Resp: 15 (!) 22 (!) 30 (!) 26  Temp: 98.6 F (37 C) 98.8 F (37.1 C) 98.6 F (37 C)   TempSrc: Axillary Axillary Axillary   SpO2: 94% 95%  93%  Weight:      Height:        Intake/Output Summary (Last 24 hours) at 09/20/2017 1040 Last data filed at 09/20/2017 0800 Gross per 24 hour  Intake 2465.8 ml  Output 400 ml  Net 2065.8 ml   Filed Weights   10/03/2017 1327 10/13/2017 2231  Weight: 107 lb (48.5 kg) 106 lb 0.7 oz (48.1 kg)    Telemetry    Sinus with PACs- Personally Reviewed   Physical Exam   GEN: WD extremely frail NAD Neck: No JVD Cardiac: RRR Respiratory: CTA anteriorly GI: Soft, mild diffuse tenderness, no masses MS: 1-2 + edema Neuro:  moves all ext; does not answer questions appropriately; confused   Labs    Chemistry Recent Labs  Lab 09/26/2017 1352 09/19/17 0201 09/20/17 0639  NA 143 142 144  K 4.5 4.5 4.5  CL 111 117* 117*  CO2 18* 11* 16*    GLUCOSE 111* NOT CALCULATED 101*  BUN 77* 78* 87*  CREATININE 1.83* NOT CALCULATED 2.60*  CALCIUM 9.5 9.0 8.6*  PROT 5.9*  --   --   ALBUMIN 3.1*  --   --   AST 71*  --   --   ALT 77*  --   --   ALKPHOS 201*  --   --   BILITOT 1.5*  --   --   GFRNONAA 24* NOT CALCULATED 16*  GFRAA 28* NOT CALCULATED 18*  ANIONGAP 14 14 11      Hematology Recent Labs  Lab 09/26/2017 1352 09/19/17 0333 09/20/17 0639  WBC 12.4* 14.7* 16.7*  RBC 4.11 4.29 4.45  HGB 8.1* 9.0* 9.2*  HCT 29.5* 31.2* 32.7*  MCV 71.8* 72.7* 73.5*  MCH 19.7* 21.0* 20.7*  MCHC 27.5* 28.8* 28.1*  RDW 18.4* 19.7* 19.7*  PLT 200 153 184    Cardiac Enzymes Recent Labs  Lab 09/19/17 0951 09/19/17 1747 09/19/17 2309 09/20/17 0639  TROPONINI 8.04* 12.82* 14.02* 18.81*    BNP Recent Labs  Lab 10/02/2017 1352  BNP 1,061.0*      Radiology    Ct Abdomen Pelvis Wo Contrast  Result Date: 09/28/2017 CLINICAL DATA:  Initial evaluation for acute altered mental  status. Abdominal pain. EXAM: CT ABDOMEN AND PELVIS WITHOUT CONTRAST TECHNIQUE: Multidetector CT imaging of the abdomen and pelvis was performed following the standard protocol without IV contrast. COMPARISON:  Prior CT from 05/22/2014. FINDINGS: Lower chest: Moderate bilateral pleural effusions, right slightly larger than left. Associated bibasilar atelectasis. Moderate cardiomegaly. Decreased density within the cardiac blood pool suggests anemia. Three vessel coronary artery calcifications. Trace pericardial effusion. Hepatobiliary: Limited noncontrast evaluation of the liver is grossly unremarkable. Few punctate granulomas noted within the left hepatic lobe. Cholelithiasis. No findings to suggest acute cholecystitis on this noncontrast examination. No appreciable biliary dilatation. Pancreas: Pancreas grossly stable in appearance without acute abnormality. Previously seen cystic lucency within the pancreatic body not well delineated on this noncontrast examination.  Irregular pancreatic ductal dilatation grossly stable. No acute abnormality about the pancreas. Spleen: Spleen within normal limits. Adrenals/Urinary Tract: Adrenal glands unremarkable. Kidneys fairly equal in size. Nonobstructive calculi measuring up to 5 mm present within the lower pole of the right kidney. Few scattered hyperdense lesions within the bilateral kidneys, largest of which positioned within the interpolar left kidney measure up to 11 mm. While these are indeterminate, findings favored to reflect a small proteinaceous and/ or hemorrhagic cyst. These are new from prior exam. Additional simple right renal cyst noted. No appreciable hydroureter. Bladder moderately distended without acute abnormality. Stomach/Bowel: Stomach within normal limits. No evidence for bowel obstruction. Colonic diverticulosis without evidence for acute diverticulitis. No definite acute inflammatory changes seen about the bowels. Vascular/Lymphatic: Extensive atherosclerosis throughout the intra- abdominal aorta and its branch vessels. No aneurysm. No appreciable adenopathy identified on this noncontrast examination. Reproductive: Uterus not well visualized, and may be absent. No adnexal mass. Ovaries not discretely identified. Other: No free intraperitoneal air. Diffuse mesenteric edema without frank free fluid, suspected to be related to volume status. Musculoskeletal: Diffuse anasarca noted. The no acute osseus abnormality. No worrisome lytic or blastic osseous lesions. Thoracolumbar scoliosis noted. IMPRESSION: 1. Cardiomegaly with moderate bilateral pleural effusions, mesenteric edema, and diffuse anasarca. Findings suspected to be related to underlying cardiac dysfunction. 2. No other acute intra-abdominal or pelvic process identified. 3. Advanced atherosclerosis with diffuse 3 vessel coronary artery calcifications. 4. Colonic diverticulosis without evidence for acute diverticulitis. 5. Cholelithiasis. 6. Nonobstructive  right renal nephrolithiasis. 7. Scattered bilateral hyperdense renal lesions, indeterminate, but new from 2015. While these are suspected to reflect proteinaceous and/or hemorrhagic cysts, these are not well evaluated on this noncontrast examination. Further evaluation with renal mass protocol CT suggested for full characterization. This would be most appropriately performed on a nonemergent outpatient basis. Electronically Signed   By: Jeannine Boga M.D.   On: 09/28/2017 16:21   Dg Chest 1 View  Result Date: 10/05/2017 CLINICAL DATA:  Altered mental status EXAM: CHEST 1 VIEW COMPARISON:  May 22, 2014 FINDINGS: There is a small left pleural effusion. There is a minimal right pleural effusion. There is patchy atelectasis in the left base. Lungs elsewhere clear. Heart size and pulmonary vascularity are normal. No adenopathy. There is aortic atherosclerosis. No evident bone lesions. IMPRESSION: Small pleural effusions bilaterally, left larger than right. Atelectatic change left base. Lungs elsewhere clear. Stable cardiac silhouette. There is aortic atherosclerosis. Aortic Atherosclerosis (ICD10-I70.0). Electronically Signed   By: Lowella Grip III M.D.   On: 09/23/2017 14:48   Ct Head Wo Contrast  Result Date: 10/06/2017 CLINICAL DATA:  Altered level of consciousness EXAM: CT HEAD WITHOUT CONTRAST TECHNIQUE: Contiguous axial images were obtained from the base of the skull through the vertex without  intravenous contrast. COMPARISON:  None. FINDINGS: Brain: No evidence of acute infarction, hemorrhage, hydrocephalus, extra-axial collection or mass lesion/mass effect. Cortical atrophy asymmetric to the right, most convincing at the level of the temporal lobe. Mild cerebellar atrophy. Mild chronic small vessel ischemia. Vascular: Atherosclerotic calcification. Peripherally calcified structure along the right cavernous carotid, favor aneurysm over tortuous vessel based on reformats Skull: Heterogeneous  density likely from osteopenia. No acute or focal aggressive finding. Sinuses/Orbits: Negative IMPRESSION: 1. No acute finding. 2. Cortical atrophy asymmetrically involving the right temporal lobe. 3. Probable 8 mm right cavernous ICA aneurysm. Electronically Signed   By: Monte Fantasia M.D.   On: 10/09/2017 16:00   US Venous Img Lower Unilateral Left  Result Date: 09/17/2017 CLINICAL DATA:  Weakness. EXAM: Left LOWER EXTREMITY VENOUS DOPPLER ULTRASOUND TECHNIQUE: Gray-scale sonography with graded compression, as well as color Doppler and duplex ultrasound were performed to evaluate the lower extremity deep venous systems from the level of the common femoral vein and including the common femoral, femoral, profunda femoral, popliteal and calf veins including the posterior tibial, peroneal and gastrocnemius veins when visible. The superficial great saphenous vein was also interrogated. Spectral Doppler was utilized to evaluate flow at rest and with distal augmentation maneuvers in the common femoral, femoral and popliteal veins. COMPARISON:  None. FINDINGS: Contralateral Common Femoral Vein: Respiratory phasicity is normal and symmetric with the symptomatic side. No evidence of thrombus. Normal compressibility. Common Femoral Vein: Noncompressible with partial flow consistent with thrombosis. Saphenofemoral Junction: Noncompressible with no flow consistent with occlusive thrombus. Profunda Femoral Vein: Noncompressible with partial flow consistent with thrombosis. Femoral Vein: Noncompressible with no flow consistent with occlusive thrombus. Popliteal Vein: Noncompressible with no flow consistent with occlusive thrombus. Calf Veins: Noncompressible with no flow consistent with occlusive thrombus. Venous Reflux:  None. Other Findings:  None. IMPRESSION: Acute deep venous thrombosis is seen involving the left common femoral, profunda femoral, superficial femoral, popliteal and calf veins. Electronically Signed    By: Marijo Conception, M.D.   On: 09/21/2017 15:11   Dg Foot Complete Left  Result Date: 10/16/2017 CLINICAL DATA:  Pain and swelling EXAM: LEFT FOOT - COMPLETE 3+ VIEW COMPARISON:  None. FINDINGS: Frontal, oblique, and lateral views were obtained. There is mild generalized soft tissue swelling. Bones are osteoporotic. No evident fracture or dislocation. Joint spaces are unremarkable. No erosive change. There are scattered foci of arterial vascular calcification in the ankle region. IMPRESSION: No fracture or dislocation. Bones osteoporotic. Mild soft tissue swelling. No appreciable joint space narrowing or erosion. Trifurcation arterial vessel atherosclerosis in the ankle region. Electronically Signed   By: Lowella Grip III M.D.   On: 10/07/2017 14:51    Patient Profile     83 y.o. female with past medical history of diabetes, hypertension, COPD, anemia, alcohol abuse admitted after she did not answer her door and found lying on her couch in urine. She was noted to have microcytic anemia, acute renal failure, elevated troponin, atrial fibrillation, lower extremity DVT and diffuse anasarca on abdominal CT. Cardiology asked to evaluate.  Assessment & Plan    1 atrial fibrillation-Newly diagnosed atrial fibrillation; now in sinus; continue amiodarone. Continue IV heparin. Change metoprolol to coreg 3.125 mg BID given severely reduced LV function. TSH is normal.   2 non-ST elevation myocardial infarction-patient has ruled in for a myocardial infarction. Echocardiogram shows severely reduced LV function with wall motion abnormalities. Also severe RV dysfunction. At present she is not a candidate for cardiac catheterization given frail body  habitus, confusion and acute renal failure. We'll continue with aspirin and heparin. She is volume overloaded and her renal function is worse. I will add low-dose milrinone. We will add low-dose carvedilol in place of metoprolol. However I will not advance in the  setting of acute CHF. No ACE inhibitor given renal insufficiency. Add low dose hydralazine today and nitrates in AM if BP stable.   3 acute systolic congestive heart failure-she is volume overloaded but renal function is deteriorating. I will add milrinone to see if this helps. I will hold on diuresis for now. Her prognosis appears to be poor. Would ask palliative care to evaluate.   4 acute renal failure- likely secondary to hypoperfusion. We will add milrinone.   5 alcohol abuse-patient's grandson states she has significant alcohol abuse. Follow for withdrawal.  6 microcytic anemia-further workup per primary care.  7 DVT-continue heparin for now.   8 encephalopathy-further evaluation per primary care.  For questions or updates, please contact [facility] Please consult [email or web address] for contact info under Cardiology/STEMI.      Signed, Kirk Ruths, MD  09/20/2017, 10:40 AM

## 2017-09-20 NOTE — Progress Notes (Signed)
ANTICOAGULATION CONSULT NOTE - Follow Up Consult  Pharmacy Consult for heparin Indication: Afib and new DVT  Labs: Recent Labs    09/21/2017 1352 10/08/2017 1600 10/11/2017 1604  09/19/17 0201 09/19/17 0333  09/19/17 1201 09/19/17 1747 09/19/17 2309 09/20/17 0639  HGB 8.1*  --   --   --   --  9.0*  --   --   --   --  9.2*  HCT 29.5*  --   --   --   --  31.2*  --   --   --   --  32.7*  PLT 200  --   --   --   --  153  --   --   --   --  184  APTT  --   --  32  --   --   --   --   --   --   --   --   LABPROT 22.4*  --   --   --   --   --   --   --   --   --  20.2*  INR 1.99  --   --   --   --   --   --   --   --   --  1.74  HEPARINUNFRC  --   --   --    < > <0.10*  --   --  0.19*  --  0.39 0.47  CREATININE 1.83*  --   --   --  NOT CALCULATED  --   --   --   --   --  2.60*  CKTOTAL  --  111  --   --   --   --   --  193  --   --   --   TROPONINI 4.90*  --   --   --  6.12*  --    < >  --  12.82* 14.02* 18.81*   < > = values in this interval not displayed.    Assessment: 89 yoF on IV heparin for new DVT and new onset AFib. Heparin level therapeutic at 0.47, CBC stable.  Goal of Therapy:  Heparin level 0.3-0.7 units/ml   Plan:  -Continue heparin 1200units/hr -Daily heparin level, CBC, S/Sx bleeding  Arrie Senate, PharmD PGY-2 Cardiology Pharmacy Resident Pager: [phone] 09/20/2017

## 2017-09-20 NOTE — Progress Notes (Signed)
Pt currently back in A-fib. Will continue to monitor.

## 2017-09-20 NOTE — Progress Notes (Signed)
PROGRESS NOTE    Tina Goodwin  JOA:[id number] DOB: 21-Nov-1931 DOA: 10/05/2017 PCP: Fayrene Helper, MD   Brief Narrative: Tina Goodwin is a 83 y.o. female she is unable to give any history of present, per chart has a history of hypertension, diabetes, COPD, chronic anemia. She presented with acute encephalopathy and found to have an NSTEMI.    Assessment & Plan:   Principal Problem:   NSTEMI (non-ST elevated myocardial infarction) ([place]) Active Problems:   Atrial fibrillation with RVR (H[facility])   Anemia   Leukocytosis   ARF (acute renal failure) ([place])   NSTEMI Patient with rising troponin and significant chest pain. Mildly tachycardic. Troponin continues to rise. -cardiology recommendations: heparin, aspirin, coreg, milrinone -continue morphine prn -will call palliative care consult  Acute systolic heart failure Secondary to NSTEMI. Fluid overloaded. Creatinine worsened. -hold IV fluids  Hypothermia Leukocytosis No evidence of infection. Urine/blood cultures pending. -warming blanket  Atrial fibrillation with RVR New onset. Started on amiodarone per cardiology -cardiology recommendations  Acute left lower extremity DVT Started on heparin drip -continue heparin  Acute kidney injury Likely secondary to hypoperfusion in setting of acute heart failure. -repeat BMP  Anemia Stable.  Transaminitis Possibly secondary to hypoperfusion -repeat CMP  Anasarca Secondary to heart failure   DVT prophylaxis: Heparin IV Code Status: Full code Family Communication: Called daughter over the phone Disposition Plan: Discharge pending cardiology recommendations   Consultants:   Cardiology  Palliative care  Procedures:   None  Antimicrobials:  None    Subjective: Not answering questions appropriately.  Objective: Vitals:   09/20/17 0300 09/20/17 0747 09/20/17 0800 09/20/17 1200  BP: (!) 146/68   137/62  Pulse: 81 80 82 75  Resp: (!) 22 (!) 30  (!) 26 (!) 23  Temp: 98.8 F (37.1 C) 98.6 F (37 C)  98.6 F (37 C)  TempSrc: Axillary Axillary  Axillary  SpO2: 95%  93% 91%  Weight:      Height:        Intake/Output Summary (Last 24 hours) at 09/20/2017 1349 Last data filed at 09/20/2017 1200 Gross per 24 hour  Intake 2188.1 ml  Output 400 ml  Net 1788.1 ml   Filed Weights   10/14/2017 1327 09/28/2017 2231  Weight: 48.5 kg (107 lb) 48.1 kg (106 lb 0.7 oz)    Examination:  General exam: Appears calm and comfortable Respiratory system: Clear to auscultation. Respiratory effort normal. Cardiovascular system: S1 & S2 heard, irregular rhythm, normal rate. No murmurs, rubs, gallops or clicks. Gastrointestinal system: Abdomen is nondistended, soft and nontender. No organomegaly or masses felt. Normal bowel sounds heard. Central nervous system: Alert. Extremities: No calf tenderness Skin: No cyanosis. No rashes Psychiatry: Judgement and insight appear impaired. Mood & affect depressed and flat.     Data Reviewed: I have personally reviewed following labs and imaging studies  CBC: Recent Labs  Lab 10/04/2017 1352 09/19/17 0333 09/20/17 0639  WBC 12.4* 14.7* 16.7*  NEUTROABS 11.5*  --   --   HGB 8.1* 9.0* 9.2*  HCT 29.5* 31.2* 32.7*  MCV 71.8* 72.7* 73.5*  PLT 200 153 601   Basic Metabolic Panel: Recent Labs  Lab 09/27/2017 1352 09/19/17 0201 09/20/17 0639  NA 143 142 144  K 4.5 4.5 4.5  CL 111 117* 117*  CO2 18* 11* 16*  GLUCOSE 111* NOT CALCULATED 101*  BUN 77* 78* 87*  CREATININE 1.83* NOT CALCULATED 2.60*  CALCIUM 9.5 9.0 8.6*   GFR: Estimated Creatinine Clearance:  12.4 mL/min (A) (by C-G formula based on SCr of 2.6 mg/dL (H)). Liver Function Tests: Recent Labs  Lab 10/12/2017 1352  AST 71*  ALT 77*  ALKPHOS 201*  BILITOT 1.5*  PROT 5.9*  ALBUMIN 3.1*   Recent Labs  Lab 09/17/2017 1352  LIPASE 24   Recent Labs  Lab 09/27/2017 1352  AMMONIA 13   Coagulation Profile: Recent Labs  Lab  09/28/2017 1352 09/20/17 0639  INR 1.99 1.74   Cardiac Enzymes: Recent Labs  Lab 10/06/2017 1600 09/19/17 0201 09/19/17 0951 09/19/17 1201 09/19/17 1747 09/19/17 2309 09/20/17 0639  CKTOTAL 111  --   --  193  --   --   --   TROPONINI  --  6.12* 8.04*  --  12.82* 14.02* 18.81*   BNP (last 3 results) No results for input(s): PROBNP in the last 8760 hours. HbA1C: No results for input(s): HGBA1C in the last 72 hours. CBG: Recent Labs  Lab 09/20/17 0745  GLUCAP 93   Lipid Profile: No results for input(s): CHOL, HDL, LDLCALC, TRIG, CHOLHDL, LDLDIRECT in the last 72 hours. Thyroid Function Tests: Recent Labs    10/14/2017 1352 09/19/17 0201  TSH 1.893 2.208  FREET4 1.03  --    Anemia Panel: No results for input(s): VITAMINB12, FOLATE, FERRITIN, TIBC, IRON, RETICCTPCT in the last 72 hours. Sepsis Labs: Recent Labs  Lab 10/01/2017 1443 09/19/17 0740  LATICACIDVEN 2.68* 2.6*    Recent Results (from the past 240 hour(s))  Urine culture     Status: Abnormal (Preliminary result)   Collection Time: 10/02/2017  4:44 PM  Result Value Ref Range Status   Specimen Description URINE, CATHETERIZED  Final   Special Requests NONE  Final   Culture 80,000 COLONIES/mL GRAM NEGATIVE RODS (A)  Final   Report Status PENDING  Incomplete  MRSA PCR Screening     Status: None   Collection Time: 09/30/2017 10:15 PM  Result Value Ref Range Status   MRSA by PCR NEGATIVE NEGATIVE Final    Comment:        The GeneXpert MRSA Assay (FDA approved for NASAL specimens only), is one component of a comprehensive MRSA colonization surveillance program. It is not intended to diagnose MRSA infection nor to guide or monitor treatment for MRSA infections.          Radiology Studies: Ct Abdomen Pelvis Wo Contrast  Result Date: 09/28/2017 CLINICAL DATA:  Initial evaluation for acute altered mental status. Abdominal pain. EXAM: CT ABDOMEN AND PELVIS WITHOUT CONTRAST TECHNIQUE: Multidetector CT imaging  of the abdomen and pelvis was performed following the standard protocol without IV contrast. COMPARISON:  Prior CT from 05/22/2014. FINDINGS: Lower chest: Moderate bilateral pleural effusions, right slightly larger than left. Associated bibasilar atelectasis. Moderate cardiomegaly. Decreased density within the cardiac blood pool suggests anemia. Three vessel coronary artery calcifications. Trace pericardial effusion. Hepatobiliary: Limited noncontrast evaluation of the liver is grossly unremarkable. Few punctate granulomas noted within the left hepatic lobe. Cholelithiasis. No findings to suggest acute cholecystitis on this noncontrast examination. No appreciable biliary dilatation. Pancreas: Pancreas grossly stable in appearance without acute abnormality. Previously seen cystic lucency within the pancreatic body not well delineated on this noncontrast examination. Irregular pancreatic ductal dilatation grossly stable. No acute abnormality about the pancreas. Spleen: Spleen within normal limits. Adrenals/Urinary Tract: Adrenal glands unremarkable. Kidneys fairly equal in size. Nonobstructive calculi measuring up to 5 mm present within the lower pole of the right kidney. Few scattered hyperdense lesions within the bilateral kidneys, largest of  which positioned within the interpolar left kidney measure up to 11 mm. While these are indeterminate, findings favored to reflect a small proteinaceous and/ or hemorrhagic cyst. These are new from prior exam. Additional simple right renal cyst noted. No appreciable hydroureter. Bladder moderately distended without acute abnormality. Stomach/Bowel: Stomach within normal limits. No evidence for bowel obstruction. Colonic diverticulosis without evidence for acute diverticulitis. No definite acute inflammatory changes seen about the bowels. Vascular/Lymphatic: Extensive atherosclerosis throughout the intra- abdominal aorta and its branch vessels. No aneurysm. No appreciable  adenopathy identified on this noncontrast examination. Reproductive: Uterus not well visualized, and may be absent. No adnexal mass. Ovaries not discretely identified. Other: No free intraperitoneal air. Diffuse mesenteric edema without frank free fluid, suspected to be related to volume status. Musculoskeletal: Diffuse anasarca noted. The no acute osseus abnormality. No worrisome lytic or blastic osseous lesions. Thoracolumbar scoliosis noted. IMPRESSION: 1. Cardiomegaly with moderate bilateral pleural effusions, mesenteric edema, and diffuse anasarca. Findings suspected to be related to underlying cardiac dysfunction. 2. No other acute intra-abdominal or pelvic process identified. 3. Advanced atherosclerosis with diffuse 3 vessel coronary artery calcifications. 4. Colonic diverticulosis without evidence for acute diverticulitis. 5. Cholelithiasis. 6. Nonobstructive right renal nephrolithiasis. 7. Scattered bilateral hyperdense renal lesions, indeterminate, but new from 2015. While these are suspected to reflect proteinaceous and/or hemorrhagic cysts, these are not well evaluated on this noncontrast examination. Further evaluation with renal mass protocol CT suggested for full characterization. This would be most appropriately performed on a nonemergent outpatient basis. Electronically Signed   By: Jeannine Boga M.D.   On: 09/26/2017 16:21   Dg Chest 1 View  Result Date: 10/13/2017 CLINICAL DATA:  Altered mental status EXAM: CHEST 1 VIEW COMPARISON:  May 22, 2014 FINDINGS: There is a small left pleural effusion. There is a minimal right pleural effusion. There is patchy atelectasis in the left base. Lungs elsewhere clear. Heart size and pulmonary vascularity are normal. No adenopathy. There is aortic atherosclerosis. No evident bone lesions. IMPRESSION: Small pleural effusions bilaterally, left larger than right. Atelectatic change left base. Lungs elsewhere clear. Stable cardiac silhouette. There is  aortic atherosclerosis. Aortic Atherosclerosis (ICD10-I70.0). Electronically Signed   By: Lowella Grip III M.D.   On: 09/28/2017 14:48   Ct Head Wo Contrast  Result Date: 10/04/2017 CLINICAL DATA:  Altered level of consciousness EXAM: CT HEAD WITHOUT CONTRAST TECHNIQUE: Contiguous axial images were obtained from the base of the skull through the vertex without intravenous contrast. COMPARISON:  None. FINDINGS: Brain: No evidence of acute infarction, hemorrhage, hydrocephalus, extra-axial collection or mass lesion/mass effect. Cortical atrophy asymmetric to the right, most convincing at the level of the temporal lobe. Mild cerebellar atrophy. Mild chronic small vessel ischemia. Vascular: Atherosclerotic calcification. Peripherally calcified structure along the right cavernous carotid, favor aneurysm over tortuous vessel based on reformats Skull: Heterogeneous density likely from osteopenia. No acute or focal aggressive finding. Sinuses/Orbits: Negative IMPRESSION: 1. No acute finding. 2. Cortical atrophy asymmetrically involving the right temporal lobe. 3. Probable 8 mm right cavernous ICA aneurysm. Electronically Signed   By: Monte Fantasia M.D.   On: 10/06/2017 16:00   US Venous Img Lower Unilateral Left  Result Date: 09/24/2017 CLINICAL DATA:  Weakness. EXAM: Left LOWER EXTREMITY VENOUS DOPPLER ULTRASOUND TECHNIQUE: Gray-scale sonography with graded compression, as well as color Doppler and duplex ultrasound were performed to evaluate the lower extremity deep venous systems from the level of the common femoral vein and including the common femoral, femoral, profunda femoral, popliteal and calf veins  including the posterior tibial, peroneal and gastrocnemius veins when visible. The superficial great saphenous vein was also interrogated. Spectral Doppler was utilized to evaluate flow at rest and with distal augmentation maneuvers in the common femoral, femoral and popliteal veins. COMPARISON:  None.  FINDINGS: Contralateral Common Femoral Vein: Respiratory phasicity is normal and symmetric with the symptomatic side. No evidence of thrombus. Normal compressibility. Common Femoral Vein: Noncompressible with partial flow consistent with thrombosis. Saphenofemoral Junction: Noncompressible with no flow consistent with occlusive thrombus. Profunda Femoral Vein: Noncompressible with partial flow consistent with thrombosis. Femoral Vein: Noncompressible with no flow consistent with occlusive thrombus. Popliteal Vein: Noncompressible with no flow consistent with occlusive thrombus. Calf Veins: Noncompressible with no flow consistent with occlusive thrombus. Venous Reflux:  None. Other Findings:  None. IMPRESSION: Acute deep venous thrombosis is seen involving the left common femoral, profunda femoral, superficial femoral, popliteal and calf veins. Electronically Signed   By: Marijo Conception, M.D.   On: 09/23/2017 15:11   Dg Foot Complete Left  Result Date: 09/17/2017 CLINICAL DATA:  Pain and swelling EXAM: LEFT FOOT - COMPLETE 3+ VIEW COMPARISON:  None. FINDINGS: Frontal, oblique, and lateral views were obtained. There is mild generalized soft tissue swelling. Bones are osteoporotic. No evident fracture or dislocation. Joint spaces are unremarkable. No erosive change. There are scattered foci of arterial vascular calcification in the ankle region. IMPRESSION: No fracture or dislocation. Bones osteoporotic. Mild soft tissue swelling. No appreciable joint space narrowing or erosion. Trifurcation arterial vessel atherosclerosis in the ankle region. Electronically Signed   By: Lowella Grip III M.D.   On: 10/15/2017 14:51        Scheduled Meds: . atorvastatin  10 mg Oral Daily  . carvedilol  3.125 mg Oral BID WC  . ezetimibe  10 mg Oral QHS  . hydrALAZINE  10 mg Oral Q8H  . Influenza vac split quadrivalent PF  0.5 mL Intramuscular Tomorrow-1000  . levothyroxine  25 mcg Intravenous Daily  . mouth rinse   15 mL Mouth Rinse BID  . sodium chloride flush  3 mL Intravenous Q12H   Continuous Infusions: . sodium chloride    . amiodarone 30 mg/hr (09/20/17 1200)  . heparin 1,200 Units/hr (09/20/17 1200)  . milrinone 0.125 mcg/kg/min (09/20/17 1303)     LOS: 2 days     Cordelia Poche, MD [facility] 09/20/2017, 1:49 PM Pager: [phone]  If 7PM-7AM, please contact night-coverage [email or web address] Password [email or web address] 09/20/2017, 1:49 PM

## 2017-09-20 NOTE — Progress Notes (Signed)
Pt NPO and unable to take po medications.  Held po meds and notified MD.  Will continue to monitor. Tina Goodwin

## 2017-09-21 ENCOUNTER — Inpatient Hospital Stay (HOSPITAL_COMMUNITY): Payer: Medicare Other

## 2017-09-21 DIAGNOSIS — Z66 Do not resuscitate: Secondary | ICD-10-CM

## 2017-09-21 DIAGNOSIS — Z515 Encounter for palliative care: Secondary | ICD-10-CM

## 2017-09-21 DIAGNOSIS — J9601 Acute respiratory failure with hypoxia: Secondary | ICD-10-CM

## 2017-09-21 LAB — HEPARIN LEVEL (UNFRACTIONATED): HEPARIN UNFRACTIONATED: 0.52 [IU]/mL (ref 0.30–0.70)

## 2017-09-21 LAB — COMPREHENSIVE METABOLIC PANEL
ALBUMIN: 2 g/dL — AB (ref 3.5–5.0)
ALT: 55 U/L — ABNORMAL HIGH (ref 14–54)
ANION GAP: 10 (ref 5–15)
AST: 36 U/L (ref 15–41)
Alkaline Phosphatase: 136 U/L — ABNORMAL HIGH (ref 38–126)
BILIRUBIN TOTAL: 1.3 mg/dL — AB (ref 0.3–1.2)
BUN: 104 mg/dL — ABNORMAL HIGH (ref 6–20)
CO2: 16 mmol/L — ABNORMAL LOW (ref 22–32)
Calcium: 8.5 mg/dL — ABNORMAL LOW (ref 8.9–10.3)
Chloride: 119 mmol/L — ABNORMAL HIGH (ref 101–111)
Creatinine, Ser: 3.52 mg/dL — ABNORMAL HIGH (ref 0.44–1.00)
GFR, EST AFRICAN AMERICAN: 13 mL/min — AB (ref >60)
GFR, EST NON AFRICAN AMERICAN: 11 mL/min — AB (ref >60)
Glucose, Bld: 82 mg/dL (ref 65–99)
POTASSIUM: 4.4 mmol/L (ref 3.5–5.1)
Sodium: 145 mmol/L (ref 135–145)
TOTAL PROTEIN: 4.3 g/dL — AB (ref 6.5–8.1)

## 2017-09-21 LAB — URINE CULTURE: Culture: 80000 — AB

## 2017-09-21 LAB — CBC
HEMATOCRIT: 29.6 % — AB (ref 36.0–46.0)
Hemoglobin: 8.5 g/dL — ABNORMAL LOW (ref 12.0–15.0)
MCH: 20.7 pg — AB (ref 26.0–34.0)
MCHC: 28.7 g/dL — AB (ref 30.0–36.0)
MCV: 72 fL — AB (ref 78.0–100.0)
Platelets: 147 10*3/uL — ABNORMAL LOW (ref 150–400)
RBC: 4.11 MIL/uL (ref 3.87–5.11)
RDW: 20.5 % — AB (ref 11.5–15.5)
WBC: 25.1 10*3/uL — ABNORMAL HIGH (ref 4.0–10.5)

## 2017-09-21 MED ORDER — MORPHINE SULFATE (PF) 2 MG/ML IV SOLN
1.0000 mg | INTRAVENOUS | Status: DC | PRN
  Administered 2017-09-21 (×4): 1 mg via INTRAVENOUS
  Filled 2017-09-21 (×4): qty 1

## 2017-09-21 MED ORDER — MORPHINE SULFATE (PF) 2 MG/ML IV SOLN
1.0000 mg | INTRAVENOUS | Status: DC | PRN
  Administered 2017-09-21: 1 mg via INTRAVENOUS
  Filled 2017-09-21: qty 1

## 2017-09-21 MED ORDER — LORAZEPAM 2 MG/ML IJ SOLN
1.0000 mg | INTRAMUSCULAR | Status: DC | PRN
Start: 2017-09-21 — End: 2017-09-22
  Administered 2017-09-21: 1 mg via INTRAVENOUS
  Filled 2017-09-21: qty 1

## 2017-09-21 MED ORDER — ASPIRIN EC 81 MG PO TBEC: 81.0000 mg | DELAYED_RELEASE_TABLET | Freq: Every day | ORAL | Status: DC

## 2017-09-21 NOTE — Progress Notes (Signed)
PROGRESS NOTE    Tina Goodwin  XTG:[id number] DOB: [birth year] DOA: 09/19/2017 PCP: Fayrene Helper, MD   Brief Narrative: Tina Goodwin is a 83 y.o. female she is unable to give any history of present, per chart has a history of hypertension, diabetes, COPD, chronic anemia. She presented with acute encephalopathy and found to have an NSTEMI.    Assessment & Plan:   Principal Problem:   NSTEMI (non-ST elevated myocardial infarction) ([place]) Active Problems:   Atrial fibrillation with RVR (H[facility])   Anemia   Leukocytosis   ARF (acute renal failure) ([place])   NSTEMI Patient with rising troponin and significant chest pain. Mildly tachycardic. Troponin continues to rise. Per discussion with daughter, she knows that the patient would not want to continue and would rather be comfortable. -cardiology recommendations: heparin, aspirin, coreg, milrinone -continue morphine prn -palliative care consult pending -family meeting with daughter at 1pm. Son lives in [place]  Acute systolic heart failure Secondary to NSTEMI. Fluid overloaded. Creatinine worsened. Patient is declining and family likely to choose full comfort care today.  Acute respiratory failure Likely secondary to decompensated heart failure. -O2 to keep sats >92% -morphine prn dyspnea  Hypothermia Leukocytosis No evidence of infection. Urine/blood cultures pending. In setting of acute MI. -warming blanket  Atrial fibrillation with RVR New onset. Started on amiodarone per cardiology -cardiology recommendations  Acute left lower extremity DVT Started on heparin drip -continue heparin, will likely discontinue this afternoon if made comfort  Acute kidney injury Likely secondary to hypoperfusion in setting of acute heart failure. Worsening.  Anemia Stable.  Transaminitis Possibly secondary to hypoperfusion. Improved.  Anasarca Secondary to heart failure   DVT prophylaxis: Heparin IV Code Status: DNR,  changed from full code per discussion with daughter Family Communication: Family meeting today at 1pm Disposition Plan: Anticipate hospital death if family chooses to keep patient in hospital vs discharge to residential hospice pending family meeting.   Consultants:   Cardiology  Palliative care  Procedures:   None  Antimicrobials:  None    Subjective: Not answering questions appropriately.  Objective: Vitals:   09/21/17 0314 09/21/17 0800 09/21/17 0807 09/21/17 0857  BP: (!) 104/54  (!) 90/41   Pulse: 84  71   Resp: (!) 23  (!) 26   Temp: (!) 97.5 F (36.4 C) 98 F (36.7 C) 98 F (36.7 C)   TempSrc: Axillary  Axillary   SpO2: 94%  100% 97%  Weight:      Height:        Intake/Output Summary (Last 24 hours) at 09/21/2017 1015 Last data filed at 09/21/2017 0600 Gross per 24 hour  Intake 625.91 ml  Output 250 ml  Net 375.91 ml   Filed Weights   09/21/2017 1327 09/25/2017 2231  Weight: 48.5 kg (107 lb) 48.1 kg (106 lb 0.7 oz)    Examination:  General exam: Appears calm and comfortable when walking into the room. Cachectic Respiratory system: Clear to auscultation. Respiratory effort normal. Cardiovascular system: S1 & S2 heard, irregular rhythm, normal rate. No murmurs. Gastrointestinal system: Abdomen is nondistended, soft and tender. Normal bowel sounds heard. Central nervous system: Alert. Not responding to commands appropriately Extremities: No calf tenderness Skin: No cyanosis. No rashes Psychiatry: Judgement and insight appear impaired. Mood & affect depressed and flat.     Data Reviewed: I have personally reviewed following labs and imaging studies  CBC: Recent Labs  Lab 09/27/2017 1352 09/19/17 0333 09/20/17 0639 09/21/17 0343  WBC 12.4* 14.7* 16.7*  25.1*  NEUTROABS 11.5*  --   --   --   HGB 8.1* 9.0* 9.2* 8.5*  HCT 29.5* 31.2* 32.7* 29.6*  MCV 71.8* 72.7* 73.5* 72.0*  PLT 200 153 184 433*   Basic Metabolic Panel: Recent Labs  Lab  09/21/2017 1352 09/19/17 0201 09/20/17 0639 09/21/17 0343  NA 143 142 144 145  K 4.5 4.5 4.5 4.4  CL 111 117* 117* 119*  CO2 18* 11* 16* 16*  GLUCOSE 111* NOT CALCULATED 101* 82  BUN 77* 78* 87* 104*  CREATININE 1.83* NOT CALCULATED 2.60* 3.52*  CALCIUM 9.5 9.0 8.6* 8.5*   GFR: Estimated Creatinine Clearance: 9.2 mL/min (A) (by C-G formula based on SCr of 3.52 mg/dL (H)). Liver Function Tests: Recent Labs  Lab 10/09/2017 1352 09/21/17 0343  AST 71* 36  ALT 77* 55*  ALKPHOS 201* 136*  BILITOT 1.5* 1.3*  PROT 5.9* 4.3*  ALBUMIN 3.1* 2.0*   Recent Labs  Lab 10/03/2017 1352  LIPASE 24   Recent Labs  Lab 10/11/2017 1352  AMMONIA 13   Coagulation Profile: Recent Labs  Lab 10/11/2017 1352 09/20/17 0639  INR 1.99 1.74   Cardiac Enzymes: Recent Labs  Lab 09/24/2017 1600 09/19/17 0201 09/19/17 0951 09/19/17 1201 09/19/17 1747 09/19/17 2309 09/20/17 0639  CKTOTAL 111  --   --  193  --   --   --   TROPONINI  --  6.12* 8.04*  --  12.82* 14.02* 18.81*   BNP (last 3 results) No results for input(s): PROBNP in the last 8760 hours. HbA1C: No results for input(s): HGBA1C in the last 72 hours. CBG: Recent Labs  Lab 09/20/17 0745  GLUCAP 93   Lipid Profile: No results for input(s): CHOL, HDL, LDLCALC, TRIG, CHOLHDL, LDLDIRECT in the last 72 hours. Thyroid Function Tests: Recent Labs    10/13/2017 1352 09/19/17 0201  TSH 1.893 2.237  2.208  FREET4 1.03  --    Anemia Panel: No results for input(s): VITAMINB12, FOLATE, FERRITIN, TIBC, IRON, RETICCTPCT in the last 72 hours. Sepsis Labs: Recent Labs  Lab 09/20/2017 1443 09/19/17 0740  LATICACIDVEN 2.68* 2.6*    Recent Results (from the past 240 hour(s))  Urine culture     Status: Abnormal   Collection Time: 10/02/2017  4:44 PM  Result Value Ref Range Status   Specimen Description URINE, CATHETERIZED  Final   Special Requests NONE  Final   Culture 80,000 COLONIES/mL ESCHERICHIA COLI (A)  Final   Report Status  09/21/2017 FINAL  Final   Organism ID, Bacteria ESCHERICHIA COLI (A)  Final      Susceptibility   Escherichia coli - MIC*    AMPICILLIN <=2 SENSITIVE Sensitive     CEFAZOLIN <=4 SENSITIVE Sensitive     CEFTRIAXONE <=1 SENSITIVE Sensitive     CIPROFLOXACIN <=0.25 SENSITIVE Sensitive     GENTAMICIN <=1 SENSITIVE Sensitive     IMIPENEM <=0.25 SENSITIVE Sensitive     NITROFURANTOIN <=16 SENSITIVE Sensitive     TRIMETH/SULFA <=20 SENSITIVE Sensitive     AMPICILLIN/SULBACTAM <=2 SENSITIVE Sensitive     PIP/TAZO <=4 SENSITIVE Sensitive     Extended ESBL NEGATIVE Sensitive     * 80,000 COLONIES/mL ESCHERICHIA COLI  MRSA PCR Screening     Status: None   Collection Time: 09/17/2017 10:15 PM  Result Value Ref Range Status   MRSA by PCR NEGATIVE NEGATIVE Final    Comment:        The GeneXpert MRSA Assay (FDA approved for NASAL specimens  only), is one component of a comprehensive MRSA colonization surveillance program. It is not intended to diagnose MRSA infection nor to guide or monitor treatment for MRSA infections.   Culture, blood (routine x 2)     Status: None (Preliminary result)   Collection Time: 09/19/17  2:25 PM  Result Value Ref Range Status   Specimen Description BLOOD LEFT HAND  Final   Special Requests IN PEDIATRIC BOTTLE Blood Culture adequate volume  Final   Culture NO GROWTH < 24 HOURS  Final   Report Status PENDING  Incomplete  Culture, blood (routine x 2)     Status: None (Preliminary result)   Collection Time: 09/19/17  2:30 PM  Result Value Ref Range Status   Specimen Description BLOOD LEFT WRIST  Final   Special Requests   Final    BOTTLES DRAWN AEROBIC ONLY Blood Culture results may not be optimal due to an excessive volume of blood received in culture bottles   Culture NO GROWTH 1 DAY  Final   Report Status PENDING  Incomplete         Radiology Studies: No results found.      Scheduled Meds: . aspirin EC  81 mg Oral Daily  . atorvastatin  10 mg  Oral Daily  . ezetimibe  10 mg Oral QHS  . Influenza vac split quadrivalent PF  0.5 mL Intramuscular Tomorrow-1000  . levothyroxine  12.5 mcg Intravenous Once per day on Sun Mon Wed Fri  . mouth rinse  15 mL Mouth Rinse BID  . sodium chloride flush  3 mL Intravenous Q12H   Continuous Infusions: . sodium chloride    . amiodarone 30 mg/hr (09/21/17 0243)  . heparin 1,200 Units/hr (09/20/17 2100)  . milrinone 0.125 mcg/kg/min (09/20/17 2100)     LOS: 3 days     Cordelia Poche, MD [facility] 09/21/2017, 10:15 AM Pager: [phone]  If 7PM-7AM, please contact night-coverage [email or web address] Password [email or web address] 09/21/2017, 10:15 AM

## 2017-09-21 NOTE — Consult Note (Signed)
Consultation Note Date: 09/21/2017   Patient Name: Tina Goodwin  DOB: [birth year]  MRN: [id number]  Age / Sex: 83 y.o., female  PCP: Fayrene Helper, MD Referring Physician: Mariel Aloe, MD  Reason for Consultation: Establishing goals of care and Psychosocial/spiritual support  HPI/Patient Profile: 83 y.o. female admitted on 09/19/2017 with past medical  history of hypertension, diabetes, COPD, chronic anemia.    Patient was living alone in [place].  It was noticed by the mailman that she had not been taking up her mail and the police were notified.  She was found with altered mental status and transported to the hospital.  He has a daughter who lives in [place] and a son who lives in [place]  In the ED she was noticed to have many issues including hypothermia, anemia with a hemoglobin of 8.1 acute renal failure with a creatinine of 1.83, a lactic acid of 2.68, a troponin of 4.9, she was found to be in atrial fibrillation with RVR and a heart rate of 130s-140s.  Left lower extremity ultrasound also shows evidence for DVT involving the left common femoral, profunda femoral, superficial femoral, popliteal and calf veins.  CT scan of the head without acute findings.  CT abdomen and pelvis shows bilateral pleural effusions, mesenteric edema, diffuse anasarca without other acute intra-abdominal or pelvic process identified.  Workup significant for non-ST elevation MI, secondary acute systolic heart failure, acute respiratory failure, leukocytosis, atrial fib with RVR, acute kidney injury.  Family fasce treatment option decisions, advance directive decisions and anticipatory care needs.   Clinical Assessment and Goals of Care:   This NP Wadie Lessen reviewed medical records, received report from team, assessed the patient and then meet at the patient's bedside along with her daughter/Tina  Goodwin/main support person  to discuss diagnosis, prognosis, GOC, EOL wishes disposition and options.  A detailed discussion was had today regarding advanced directives.  Concepts specific to code status, artifical feeding and hydration, continued IV antibiotics and rehospitalization was had.  The difference between a aggressive medical intervention path  and a palliative comfort care path for this patient at this time was had.  Values and goals of care important to patient and family were attempted to be elicited.  Patient's daughter spoke to her mother's fierce independence and full life.  She verbalizes an understanding that her mother's time here in this physical world is limited and would like to "let her float away".   Focus of care is comfort, quality and dignity.  No life prolonging measures at this time.  "This is not her, she has left her body"  Concept of Hospice and Palliative Care were discussed  Natural trajectory and expectations at EOL were discussed.  Questions and concerns addressed.   Family encouraged to call with questions or concerns.  PMT will continue to support holistically.   There is no documented healthcare power of attorney at this time; patient's daughter Tina Goodwin is speaking and acting in good faith and relaying the patient's wishes for comfort  and dignity with no life prolonging measures  SUMMARY OF RECOMMENDATIONS    Code Status/Advance Care Planning:  DNR   Symptom Management:  Dyspnea/Pain: Morphine 2 mg IV every 1 hr prn  Transition to nasal cannula  Agitation: Ativan 1 mg IV every 4 hrs prn   Palliative Prophylaxis:   Aspiration, Frequent Pain Assessment, Oral Care and Turn Reposition  Additional Recommendations (Limitations, Scope, Preferences):  Full Comfort Care  Psycho-social/Spiritual:   Desire for further Chaplaincy support:yes  Additional Recommendations: Education on Hospice  Prognosis:   Hours - Days I expressed to the  daughter that anything could happen at any time.  Discharge Planning: Anticipated Hospital Death      Primary Diagnoses: Present on Admission: **None**   I have reviewed the medical record, interviewed the patient and family, and examined the patient. The following aspects are pertinent.  Past Medical History:  Diagnosis Date  . Allergic rhinitis   . Anemia, iron deficiency   . Cholecystitis chronic   . COPD (chronic obstructive pulmonary disease) ([place])   . Depression   . Dysuria   . Hyperlipidemia   . Hypothyroidism   . NASH (nonalcoholic steatohepatitis)   . Numbness of foot    Bilateral   . Renal mass   . Right forearm fracture   . Thyroid cancer ([place])   . Uncontrolled stage 2 hypertension    Social History   Socioeconomic History  . Marital status: Divorced    Spouse name: None  . Number of children: 2  . Years of education: None  . Highest education level: None  Social Needs  . Financial resource strain: None  . Food insecurity - worry: None  . Food insecurity - inability: None  . Transportation needs - medical: None  . Transportation needs - non-medical: None  Occupational History  . Occupation: Gaffer: RETIRED  Tobacco Use  . Smoking status: Former Smoker    Types: Cigarettes    Last attempt to quit: 07/31/2001    Years since quitting: 16.1  . Smokeless tobacco: Never Used  Substance and Sexual Activity  . Alcohol use: No  . Drug use: No  . Sexual activity: Not Currently    Birth control/protection: Surgical  Other Topics Concern  . None  Social History Narrative  . None   Family History  Problem Relation Age of Onset  . Cancer Mother   . Hypertension Mother   . Cancer Father   . Hypertension Father   . Colon cancer Neg Hx    Scheduled Meds: . aspirin EC  81 mg Oral Daily  . atorvastatin  10 mg Oral Daily  . ezetimibe  10 mg Oral QHS  . Influenza vac split quadrivalent PF  0.5 mL Intramuscular Tomorrow-1000  .  levothyroxine  12.5 mcg Intravenous Once per day on Sun Mon Wed Fri  . mouth rinse  15 mL Mouth Rinse BID  . sodium chloride flush  3 mL Intravenous Q12H   Continuous Infusions: . sodium chloride    . amiodarone 30 mg/hr (09/21/17 0243)  . heparin 1,200 Units/hr (09/20/17 2100)  . milrinone 0.125 mcg/kg/min (09/20/17 2100)   PRN Meds:.sodium chloride, acetaminophen **OR** acetaminophen, morphine injection, ondansetron **OR** ondansetron (ZOFRAN) IV, sodium chloride flush Medications Prior to Admission:  Prior to Admission medications   Medication Sig Start Date End Date Taking? Authorizing Provider  acetaminophen (TYLENOL) 500 MG tablet Take 1 tablet (500 mg total) by mouth every 6 (six) hours as  needed for mild pain. 01/24/16   Fayrene Helper, MD  atorvastatin (LIPITOR) 10 MG tablet Take 1 tablet (10 mg total) by mouth daily. 01/28/17   Fayrene Helper, MD  enalapril (VASOTEC) 20 MG tablet 2 tabs daily 05/01/17   Fayrene Helper, MD  ergocalciferol (VITAMIN D2) 50000 units capsule Take 1 capsule (50,000 Units total) by mouth once a week. One capsule once weekly 01/28/17   Fayrene Helper, MD  ezetimibe (ZETIA) 10 MG tablet Take 1 tablet (10 mg total) by mouth at bedtime. 01/28/17   Fayrene Helper, MD  furosemide (LASIX) 20 MG tablet One tablet once daily , as needed, for leg swelling, do not use more than 3 tablets in any one week 04/10/15   Fayrene Helper, MD  hydrochlorothiazide (HYDRODIURIL) 25 MG tablet Take 1 tablet (25 mg total) by mouth every morning. 05/01/17   Fayrene Helper, MD  hydrOXYzine (ATARAX/VISTARIL) 10 MG tablet One tablet at bedtime ,a s needed, for itching 05/23/16   Fayrene Helper, MD  levothyroxine (SYNTHROID) 50 MCG tablet Half tablet daily before breakfast every Monday, Wednesday, Friday and Sunday 01/28/17   Fayrene Helper, MD  montelukast (SINGULAIR) 10 MG tablet Take 1 tablet (10 mg total) by mouth at bedtime. 10/20/16   Fayrene Helper, MD  Multiple Vitamin (MULTIVITAMIN WITH MINERALS) TABS tablet Take 1 tablet by mouth daily.    [provider]  NIFEdipine (PROCARDIA-XL/ADALAT CC) 30 MG 24 hr tablet Take 1 tablet (30 mg total) by mouth daily. 05/01/17   Fayrene Helper, MD  omeprazole (PRILOSEC) 40 MG capsule Take 1 capsule (40 mg total) by mouth daily. 05/01/17   Fayrene Helper, MD  tobramycin (TOBREX) 0.3 % ophthalmic solution Place 2 drops into the left eye every 4 (four) hours. 09/13/15   Fransico Meadow, PA-C  traMADol (ULTRAM) 50 MG tablet Take 1 tablet (50 mg total) by mouth every 12 (twelve) hours as needed. 12/09/16   Fayrene Helper, MD   No Known Allergies Review of Systems  Unable to perform ROS: Patient unresponsive    Physical Exam  Constitutional: She appears cachectic. She appears ill. Face mask in place.  Cardiovascular: Tachycardia present.  Pulmonary/Chest: She has decreased breath sounds.  Neurological: She is unresponsive.  Skin: Skin is warm and dry.    Vital Signs: BP (!) 90/41 (BP Location: Left Arm)   Pulse 71   Temp 98 F (36.7 C) (Axillary)   Resp (!) 26   Ht 5\' 5"  (1.651 m)   Wt 48.1 kg (106 lb 0.7 oz)   SpO2 97%   BMI 17.65 kg/m  Pain Assessment: CPOT   Pain Score: 3    SpO2: SpO2: 97 % O2 Device:SpO2: 97 % O2 Flow Rate: .O2 Flow Rate (L/min): 2 L/min  IO: Intake/output summary:   Intake/Output Summary (Last 24 hours) at 09/21/2017 1150 Last data filed at 09/21/2017 0600 Gross per 24 hour  Intake 625.91 ml  Output 250 ml  Net 375.91 ml    LBM: Last BM Date: 09/16/17 Baseline Weight: Weight: 48.5 kg (107 lb) Most recent weight: Weight: 48.1 kg (106 lb 0.7 oz)     Palliative Assessment/Data: 20 %   Discussed with Dr Lonny Prude   Time In: 1200 Time Out: 1315 Time Total: 75 min Greater than 50%  of this time was spent counseling and coordinating care related to the above assessment and plan.  Signed by: Wadie Lessen, NP  Please  contact Palliative Medicine Team phone at [phone] for questions and concerns.  For individual provider: See Shea Evans

## 2017-09-21 NOTE — Progress Notes (Signed)
CSW alerted by RN that [facility] Colletta Maryland was requesting information on patient's condition, and was given a phone number to call. CSW contacted Colletta Maryland with [facility] to discuss prognosis of the patient, because the patient's daughter had called her this morning to say that the patient was dying. Colletta [place] indicated that there really isn't anything else for APS to do at this time, if the patient is actively dying, but can step in to assist with any family dynamic issues, if they arise; just to keep APS informed if the patient passes or if there is any difficulty that we come across with the family in providing treatment for the patient.  CSW noting that there may be a potential for residential hospice referral; CSW will continue to follow to facilitate discharge plans, if needed.  Laveda Abbe, [place] Clinical Social Worker [phone]

## 2017-09-21 NOTE — Progress Notes (Signed)
  Attempted visit with patient.  Patient not responding to my calling her name however patient seems to be heavily sleeping.  No family in room.  Chaplain will remain available should patient or family member need support.    09/21/17 1517  Clinical Encounter Type  Visited With Patient  Visit Type Initial;Critical Care;Spiritual support

## 2017-09-21 NOTE — Progress Notes (Signed)
ANTICOAGULATION CONSULT NOTE - Follow Up Consult  Pharmacy Consult for heparin Indication: Afib and new DVT  Labs: Recent Labs    10/12/2017 1352 09/21/2017 1600 10/01/2017 1604  09/19/17 0201 09/19/17 0333  09/19/17 1201 09/19/17 1747 09/19/17 2309 09/20/17 0639 09/21/17 0343  HGB 8.1*  --   --   --   --  9.0*  --   --   --   --  9.2* 8.5*  HCT 29.5*  --   --   --   --  31.2*  --   --   --   --  32.7* 29.6*  PLT 200  --   --   --   --  153  --   --   --   --  184 147*  APTT  --   --  32  --   --   --   --   --   --   --   --   --   LABPROT 22.4*  --   --   --   --   --   --   --   --   --  20.2*  --   INR 1.99  --   --   --   --   --   --   --   --   --  1.74  --   HEPARINUNFRC  --   --   --    < > <0.10*  --   --  0.19*  --  0.39 0.47 0.52  CREATININE 1.83*  --   --   --  NOT CALCULATED  --   --   --   --   --  2.60* 3.52*  CKTOTAL  --  111  --   --   --   --   --  193  --   --   --   --   TROPONINI 4.90*  --   --   --  6.12*  --    < >  --  12.82* 14.02* 18.81*  --    < > = values in this interval not displayed.    Assessment: 55 yoF on IV heparin for new DVT and new onset AFib. Heparin level therapeutic at 0.5, CBC stable.  No CTA planned with poor renal function and not able to do VQ scan at this time. Will continue heparin for now.  Goal of Therapy:  Heparin level 0.3-0.7 units/ml  Monitor platelets by anticoagulation protocol: Yes  Plan:  -Continue heparin at 1200units/hr -Daily heparin level, CBC, S/Sx bleeding  Erin Hearing PharmD., BCPS Clinical Pharmacist Pager [phone] 09/21/2017 12:46 PM

## 2017-09-21 NOTE — Progress Notes (Signed)
Progress Note  Patient Name: Tina Goodwin Date of Encounter: 09/21/2017  Primary Cardiologist: Dr Harl Bowie  Subjective   Pt does not respond appropriately to questions; opens eyes  Inpatient Medications    Scheduled Meds: . atorvastatin  10 mg Oral Daily  . carvedilol  3.125 mg Oral BID WC  . ezetimibe  10 mg Oral QHS  . Influenza vac split quadrivalent PF  0.5 mL Intramuscular Tomorrow-1000  . levothyroxine  12.5 mcg Intravenous Once per day on Sun Mon Wed Fri  . mouth rinse  15 mL Mouth Rinse BID  . sodium chloride flush  3 mL Intravenous Q12H   Continuous Infusions: . sodium chloride    . amiodarone 30 mg/hr (09/21/17 0243)  . heparin 1,200 Units/hr (09/20/17 2100)  . milrinone 0.125 mcg/kg/min (09/20/17 2100)   PRN Meds: sodium chloride, acetaminophen **OR** acetaminophen, hydrALAZINE, morphine injection, ondansetron **OR** ondansetron (ZOFRAN) IV, sodium chloride flush   Vital Signs    Vitals:   09/20/17 2000 09/20/17 2100 09/20/17 2335 09/21/17 0314  BP: (!) 108/57  (!) 101/52 (!) 104/54  Pulse: 82 80 83 84  Resp: (!) 21 20 18  (!) 23  Temp:   97.7 F (36.5 C) (!) 97.5 F (36.4 C)  TempSrc:   Axillary Axillary  SpO2: 92% 91% 92% 94%  Weight:      Height:        Intake/Output Summary (Last 24 hours) at 09/21/2017 0759 Last data filed at 09/21/2017 0600 Gross per 24 hour  Intake 1144.41 ml  Output 250 ml  Net 894.41 ml   Filed Weights   09/21/2017 1327 10/06/2017 2231  Weight: 107 lb (48.5 kg) 106 lb 0.7 oz (48.1 kg)    Telemetry    Atrial fibrillation- Personally Reviewed   Physical Exam   GEN: WD extremely frail moaning Neck: supple Cardiac: RRR Respiratory: Mildly diminised BS bases GI: Mild diffuse tenderness MS: 1 + edema Neuro:  moves all ext; does not answer questions appropriately; moaning   Labs    Chemistry Recent Labs  Lab 09/19/2017 1352 09/19/17 0201 09/20/17 0639 09/21/17 0343  NA 143 142 144 145  K 4.5 4.5 4.5 4.4    CL 111 117* 117* 119*  CO2 18* 11* 16* 16*  GLUCOSE 111* NOT CALCULATED 101* 82  BUN 77* 78* 87* 104*  CREATININE 1.83* NOT CALCULATED 2.60* 3.52*  CALCIUM 9.5 9.0 8.6* 8.5*  PROT 5.9*  --   --  4.3*  ALBUMIN 3.1*  --   --  2.0*  AST 71*  --   --  36  ALT 77*  --   --  55*  ALKPHOS 201*  --   --  136*  BILITOT 1.5*  --   --  1.3*  GFRNONAA 24* NOT CALCULATED 16* 11*  GFRAA 28* NOT CALCULATED 18* 13*  ANIONGAP 14 14 11 10      Hematology Recent Labs  Lab 09/19/17 0333 09/20/17 0639 09/21/17 0343  WBC 14.7* 16.7* 25.1*  RBC 4.29 4.45 4.11  HGB 9.0* 9.2* 8.5*  HCT 31.2* 32.7* 29.6*  MCV 72.7* 73.5* 72.0*  MCH 21.0* 20.7* 20.7*  MCHC 28.8* 28.1* 28.7*  RDW 19.7* 19.7* 20.5*  PLT 153 184 147*    Cardiac Enzymes Recent Labs  Lab 09/19/17 0951 09/19/17 1747 09/19/17 2309 09/20/17 0639  TROPONINI 8.04* 12.82* 14.02* 18.81*    BNP Recent Labs  Lab 10/05/2017 1352  BNP 1,061.0*      Radiology    No  results found.  Patient Profile     83 y.o. female with past medical history of diabetes, hypertension, COPD, anemia, alcohol abuse admitted after she did not answer her door and found lying on her couch in urine. She was noted to have microcytic anemia, acute renal failure, elevated troponin, atrial fibrillation, lower extremity DVT and diffuse anasarca on abdominal CT. Cardiology asked to evaluate.  Assessment & Plan    1 atrial fibrillation-Back in atrial fibrillation this AM; continue amiodarone. Continue IV heparin. Hold coreg as BP borderline. TSH is normal.   2 non-ST elevation myocardial infarction-patient has ruled in for a myocardial infarction. Echocardiogram shows severely reduced LV function with wall motion abnormalities. Also severe RV dysfunction. At present she is not a candidate for cardiac catheterization given frail body habitus, confusion and acute renal failure (cath would lead to dialysis). Resume asa and continue heparin. She is volume overloaded  and her renal function is worse. Continue milrinone. DC coreg as BP borderline and pt in CHF. No ACE inhibitor given renal insufficiency. Add hydralazine/nitrates later if BP allows.   3 acute systolic congestive heart failure-? Component of low oncotic pressure due to low albumin. Continue milrinone. I am hesitant to hydrate as she remains volume overloaded on exam.  4 acute renal failure- likely secondary to hypoperfusion. Continue milrinone.  5 alcohol abuse-patient's grandson states she has significant alcohol abuse. Follow for withdrawal.  6 microcytic anemia-further workup per primary care.  7 DVT-continue heparin for now. Unclear if pt also had pulmonary embolus; will not pursue CTA given renal insuff or VQ as she cannot cooperate.  8 encephalopathy-further evaluation per primary care.  Mellette on urine culture-antibiotics per primary care  Prognosis poor; agree with palliative care consult and addressing code status.  For questions or updates, please contact [place] Please consult [email or web address] for contact info under Cardiology/STEMI.      Signed, Kirk Ruths, MD  09/21/2017, 7:59 AM

## 2017-09-24 LAB — CULTURE, BLOOD (ROUTINE X 2)
CULTURE: NO GROWTH
Culture: NO GROWTH
Special Requests: ADEQUATE

## 2017-10-17 NOTE — Progress Notes (Signed)
Post mortem care completed.  Md paged again.  [place] donor paper completed.

## 2017-10-17 NOTE — Death Summary Note (Signed)
DEATH SUMMARY   Patient Details  Name: Tina Goodwin MRN: [id number] DOB: 12-07-1931  Admission/Discharge Information   Admit Date:  Sep 25, 2017  Date of Death: Date of Death: 2017-09-29  Time of Death: Time of Death: 0114  Length of Stay: 4  Referring Physician: Fayrene Helper, MD   Reason(s) for Hospitalization  Non-ST elevation myocardial infarction  Diagnoses  Preliminary cause of death:  Secondary Diagnoses (including complications and co-morbidities):  Principal Problem:   NSTEMI (non-ST elevated myocardial infarction) [facility]) Active Problems:   Atrial fibrillation with RVR ([place])   Anemia   Leukocytosis   ARF (acute renal failure) ([place])   Palliative care by specialist   DNR (do not resuscitate)   Acute respiratory failure with hypoxia [facility])   [place] Hospital Course (including significant findings, care, treatment, and services provided and events leading to death)  Tina Goodwin is a 83 y.o. year old female who presented after being found down and was found to have a non-STEMI.  She was transferred from [facility] to the hospital for cardiology coverage.  She had associated worsening kidney failure and respiratory failure secondary to heart failure from myocardial infarction.  She had worsening mental status as well.  Cardiology did not recommend invasive treatment for her non-STEMI s she was managed with econdary to acute renal failure and acute encephalopathy in addition to patient's is overall frailty.  Aspirin and milrinone. Family meeting occurred on 09/21/2017 with the decision for comfort care.  Cardiology consulted    Pertinent Labs and Studies  Significant Diagnostic Studies Ct Abdomen Pelvis Wo Contrast  Result Date: 09/25/2017 CLINICAL DATA:  Initial evaluation for acute altered mental status. Abdominal pain. EXAM: CT ABDOMEN AND PELVIS WITHOUT CONTRAST TECHNIQUE: Multidetector CT imaging of the abdomen and pelvis was performed following the  standard protocol without IV contrast. COMPARISON:  Prior CT from 05/22/2014. FINDINGS: Lower chest: Moderate bilateral pleural effusions, right slightly larger than left. Associated bibasilar atelectasis. Moderate cardiomegaly. Decreased density within the cardiac blood pool suggests anemia. Three vessel coronary artery calcifications. Trace pericardial effusion. Hepatobiliary: Limited noncontrast evaluation of the liver is grossly unremarkable. Few punctate granulomas noted within the left hepatic lobe. Cholelithiasis. No findings to suggest acute cholecystitis on this noncontrast examination. No appreciable biliary dilatation. Pancreas: Pancreas grossly stable in appearance without acute abnormality. Previously seen cystic lucency within the pancreatic body not well delineated on this noncontrast examination. Irregular pancreatic ductal dilatation grossly stable. No acute abnormality about the pancreas. Spleen: Spleen within normal limits. Adrenals/Urinary Tract: Adrenal glands unremarkable. Kidneys fairly equal in size. Nonobstructive calculi measuring up to 5 mm present within the lower pole of the right kidney. Few scattered hyperdense lesions within the bilateral kidneys, largest of which positioned within the interpolar left kidney measure up to 11 mm. While these are indeterminate, findings favored to reflect a small proteinaceous and/ or hemorrhagic cyst. These are new from prior exam. Additional simple right renal cyst noted. No appreciable hydroureter. Bladder moderately distended without acute abnormality. Stomach/Bowel: Stomach within normal limits. No evidence for bowel obstruction. Colonic diverticulosis without evidence for acute diverticulitis. No definite acute inflammatory changes seen about the bowels. Vascular/Lymphatic: Extensive atherosclerosis throughout the intra- abdominal aorta and its branch vessels. No aneurysm. No appreciable adenopathy identified on this noncontrast examination.  Reproductive: Uterus not well visualized, and may be absent. No adnexal mass. Ovaries not discretely identified. Other: No free intraperitoneal air. Diffuse mesenteric edema without frank free fluid, suspected to be related to volume status. Musculoskeletal:  Diffuse anasarca noted. The no acute osseus abnormality. No worrisome lytic or blastic osseous lesions. Thoracolumbar scoliosis noted. IMPRESSION: 1. Cardiomegaly with moderate bilateral pleural effusions, mesenteric edema, and diffuse anasarca. Findings suspected to be related to underlying cardiac dysfunction. 2. No other acute intra-abdominal or pelvic process identified. 3. Advanced atherosclerosis with diffuse 3 vessel coronary artery calcifications. 4. Colonic diverticulosis without evidence for acute diverticulitis. 5. Cholelithiasis. 6. Nonobstructive right renal nephrolithiasis. 7. Scattered bilateral hyperdense renal lesions, indeterminate, but new from 2015. While these are suspected to reflect proteinaceous and/or hemorrhagic cysts, these are not well evaluated on this noncontrast examination. Further evaluation with renal mass protocol CT suggested for full characterization. This would be most appropriately performed on a nonemergent outpatient basis. Electronically Signed   By: Jeannine Boga M.D.   On: 09/20/2017 16:21   Dg Chest 1 View  Result Date: 09/19/2017 CLINICAL DATA:  Altered mental status EXAM: CHEST 1 VIEW COMPARISON:  May 22, 2014 FINDINGS: There is a small left pleural effusion. There is a minimal right pleural effusion. There is patchy atelectasis in the left base. Lungs elsewhere clear. Heart size and pulmonary vascularity are normal. No adenopathy. There is aortic atherosclerosis. No evident bone lesions. IMPRESSION: Small pleural effusions bilaterally, left larger than right. Atelectatic change left base. Lungs elsewhere clear. Stable cardiac silhouette. There is aortic atherosclerosis. Aortic Atherosclerosis  (ICD10-I70.0). Electronically Signed   By: Lowella Grip III M.D.   On: 10/16/2017 14:48   Ct Head Wo Contrast  Result Date: 10/09/2017 CLINICAL DATA:  Altered level of consciousness EXAM: CT HEAD WITHOUT CONTRAST TECHNIQUE: Contiguous axial images were obtained from the base of the skull through the vertex without intravenous contrast. COMPARISON:  None. FINDINGS: Brain: No evidence of acute infarction, hemorrhage, hydrocephalus, extra-axial collection or mass lesion/mass effect. Cortical atrophy asymmetric to the right, most convincing at the level of the temporal lobe. Mild cerebellar atrophy. Mild chronic small vessel ischemia. Vascular: Atherosclerotic calcification. Peripherally calcified structure along the right cavernous carotid, favor aneurysm over tortuous vessel based on reformats Skull: Heterogeneous density likely from osteopenia. No acute or focal aggressive finding. Sinuses/Orbits: Negative IMPRESSION: 1. No acute finding. 2. Cortical atrophy asymmetrically involving the right temporal lobe. 3. Probable 8 mm right cavernous ICA aneurysm. Electronically Signed   By: Monte Fantasia M.D.   On: 09/17/2017 16:00   US Venous Img Lower Unilateral Left  Result Date: 09/26/2017 CLINICAL DATA:  Weakness. EXAM: Left LOWER EXTREMITY VENOUS DOPPLER ULTRASOUND TECHNIQUE: Gray-scale sonography with graded compression, as well as color Doppler and duplex ultrasound were performed to evaluate the lower extremity deep venous systems from the level of the common femoral vein and including the common femoral, femoral, profunda femoral, popliteal and calf veins including the posterior tibial, peroneal and gastrocnemius veins when visible. The superficial great saphenous vein was also interrogated. Spectral Doppler was utilized to evaluate flow at rest and with distal augmentation maneuvers in the common femoral, femoral and popliteal veins. COMPARISON:  None. FINDINGS: Contralateral Common Femoral Vein:  Respiratory phasicity is normal and symmetric with the symptomatic side. No evidence of thrombus. Normal compressibility. Common Femoral Vein: Noncompressible with partial flow consistent with thrombosis. Saphenofemoral Junction: Noncompressible with no flow consistent with occlusive thrombus. Profunda Femoral Vein: Noncompressible with partial flow consistent with thrombosis. Femoral Vein: Noncompressible with no flow consistent with occlusive thrombus. Popliteal Vein: Noncompressible with no flow consistent with occlusive thrombus. Calf Veins: Noncompressible with no flow consistent with occlusive thrombus. Venous Reflux:  None. Other Findings:  None.  IMPRESSION: Acute deep venous thrombosis is seen involving the left common femoral, profunda femoral, superficial femoral, popliteal and calf veins. Electronically Signed   By: Marijo Conception, M.D.   On: 10/07/2017 15:11   Dg Chest Port 1 View  Result Date: 09/21/2017 CLINICAL DATA:  Respiratory failure. EXAM: PORTABLE CHEST 1 VIEW COMPARISON:  Radiograph of September 18, 2017. FINDINGS: Stable cardiomegaly. Atherosclerosis of thoracic aorta is noted. No pneumothorax is noted. Increased bibasilar effusions are noted with associated atelectasis. Bony thorax is unremarkable. IMPRESSION: Aortic atherosclerosis. Increased bilateral pleural effusions are noted with associated atelectasis. Electronically Signed   By: Marijo Conception, M.D.   On: 09/21/2017 10:58   Dg Foot Complete Left  Result Date: 10/12/2017 CLINICAL DATA:  Pain and swelling EXAM: LEFT FOOT - COMPLETE 3+ VIEW COMPARISON:  None. FINDINGS: Frontal, oblique, and lateral views were obtained. There is mild generalized soft tissue swelling. Bones are osteoporotic. No evident fracture or dislocation. Joint spaces are unremarkable. No erosive change. There are scattered foci of arterial vascular calcification in the ankle region. IMPRESSION: No fracture or dislocation. Bones osteoporotic. Mild soft tissue  swelling. No appreciable joint space narrowing or erosion. Trifurcation arterial vessel atherosclerosis in the ankle region. Electronically Signed   By: Lowella Grip III M.D.   On: 10/09/2017 14:51    Microbiology Recent Results (from the past 240 hour(s))  Urine culture     Status: Abnormal   Collection Time: 10/03/2017  4:44 PM  Result Value Ref Range Status   Specimen Description URINE, CATHETERIZED  Final   Special Requests NONE  Final   Culture 80,000 COLONIES/mL ESCHERICHIA COLI (A)  Final   Report Status 09/21/2017 FINAL  Final   Organism ID, Bacteria ESCHERICHIA COLI (A)  Final      Susceptibility   Escherichia coli - MIC*    AMPICILLIN <=2 SENSITIVE Sensitive     CEFAZOLIN <=4 SENSITIVE Sensitive     CEFTRIAXONE <=1 SENSITIVE Sensitive     CIPROFLOXACIN <=0.25 SENSITIVE Sensitive     GENTAMICIN <=1 SENSITIVE Sensitive     IMIPENEM <=0.25 SENSITIVE Sensitive     NITROFURANTOIN <=16 SENSITIVE Sensitive     TRIMETH/SULFA <=20 SENSITIVE Sensitive     AMPICILLIN/SULBACTAM <=2 SENSITIVE Sensitive     PIP/TAZO <=4 SENSITIVE Sensitive     Extended ESBL NEGATIVE Sensitive     * 80,000 COLONIES/mL ESCHERICHIA COLI  MRSA PCR Screening     Status: None   Collection Time: 10/14/2017 10:15 PM  Result Value Ref Range Status   MRSA by PCR NEGATIVE NEGATIVE Final    Comment:        The GeneXpert MRSA Assay (FDA approved for NASAL specimens only), is one component of a comprehensive MRSA colonization surveillance program. It is not intended to diagnose MRSA infection nor to guide or monitor treatment for MRSA infections.   Culture, blood (routine x 2)     Status: None (Preliminary result)   Collection Time: 09/19/17  2:25 PM  Result Value Ref Range Status   Specimen Description BLOOD LEFT HAND  Final   Special Requests IN PEDIATRIC BOTTLE Blood Culture adequate volume  Final   Culture NO GROWTH 2 DAYS  Final   Report Status PENDING  Incomplete  Culture, blood (routine x 2)      Status: None (Preliminary result)   Collection Time: 09/19/17  2:30 PM  Result Value Ref Range Status   Specimen Description BLOOD LEFT WRIST  Final   Special Requests  Final    BOTTLES DRAWN AEROBIC ONLY Blood Culture results may not be optimal due to an excessive volume of blood received in culture bottles   Culture NO GROWTH 2 DAYS  Final   Report Status PENDING  Incomplete    Lab Basic Metabolic Panel: Recent Labs  Lab 10/08/2017 1352 09/19/17 0201 09/20/17 0639 09/21/17 0343  NA 143 142 144 145  K 4.5 4.5 4.5 4.4  CL 111 117* 117* 119*  CO2 18* 11* 16* 16*  GLUCOSE 111* NOT CALCULATED 101* 82  BUN 77* 78* 87* 104*  CREATININE 1.83* NOT CALCULATED 2.60* 3.52*  CALCIUM 9.5 9.0 8.6* 8.5*   Liver Function Tests: Recent Labs  Lab 10/02/2017 1352 09/21/17 0343  AST 71* 36  ALT 77* 55*  ALKPHOS 201* 136*  BILITOT 1.5* 1.3*  PROT 5.9* 4.3*  ALBUMIN 3.1* 2.0*   Recent Labs  Lab 09/21/2017 1352  LIPASE 24   Recent Labs  Lab 10/04/2017 1352  AMMONIA 13   CBC: Recent Labs  Lab 09/17/2017 1352 09/19/17 0333 09/20/17 0639 09/21/17 0343  WBC 12.4* 14.7* 16.7* 25.1*  NEUTROABS 11.5*  --   --   --   HGB 8.1* 9.0* 9.2* 8.5*  HCT 29.5* 31.2* 32.7* 29.6*  MCV 71.8* 72.7* 73.5* 72.0*  PLT 200 153 184 147*   Cardiac Enzymes: Recent Labs  Lab 09/17/2017 1600 09/19/17 0201 09/19/17 0951 09/19/17 1201 09/19/17 1747 09/19/17 2309 09/20/17 0639  CKTOTAL 111  --   --  193  --   --   --   TROPONINI  --  6.12* 8.04*  --  12.82* 14.02* 18.81*   Sepsis Labs: Recent Labs  Lab 09/20/2017 1352 10/13/2017 1443 09/19/17 0333 09/19/17 0740 09/20/17 0639 09/21/17 0343  WBC 12.4*  --  14.7*  --  16.7* 25.1*  LATICACIDVEN  --  2.68*  --  2.6*  --   --     Procedures/Operations  None   Cordelia Poche, MD 2017/10/07, 9:41 AM

## 2017-10-17 NOTE — Progress Notes (Addendum)
Pt passed at 0114.  Listened for 1 full minute, no lung or heart sounds noted.  Another RN [facility]) verified no lung or heart sounds.  Md paged. Daughter called.  Saunders Revel T

## 2017-10-17 DEATH — deceased

## 2018-06-03 IMAGING — US US EXTREM LOW VENOUS*L*
1 series · 13 of 24 positions shown · non-contrast
Comparison: None.

CLINICAL DATA: Weakness.



[Series 1: us extrem low venous*left* · 0.08mm/px · 13 of 39 slices shown]
[im 1/39]
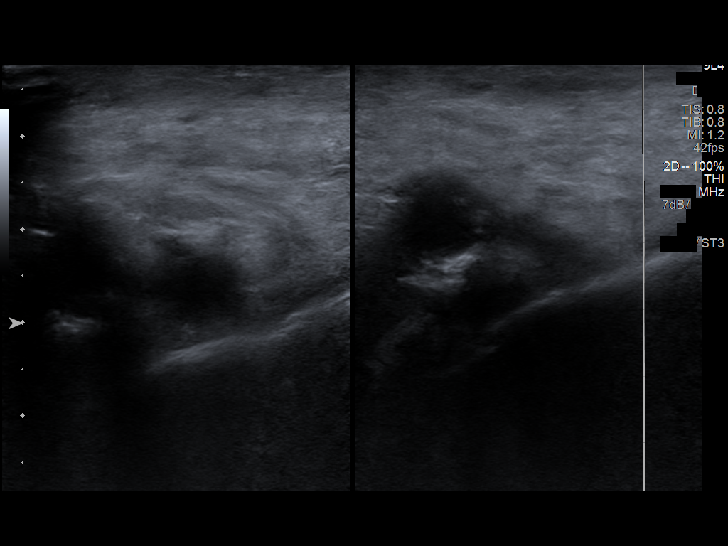
[im 4/39]
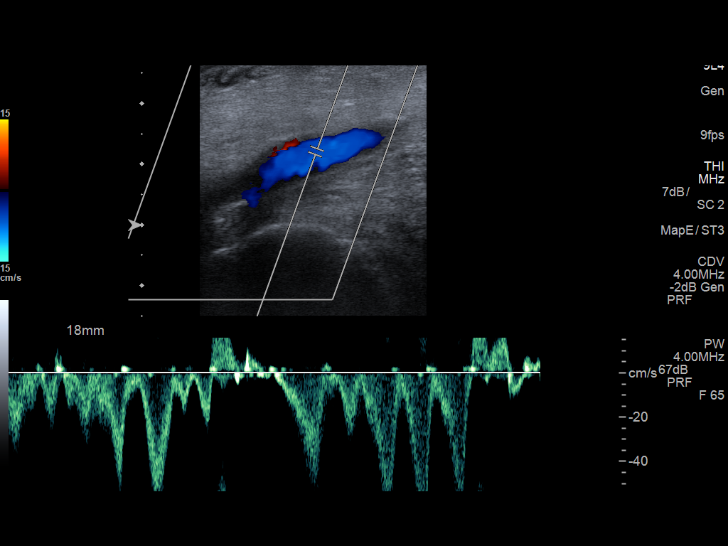
[im 7/39]
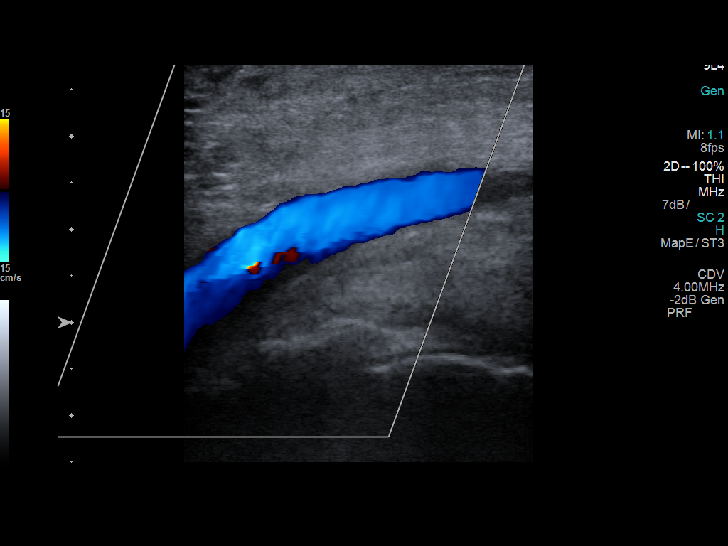
[im 10/39]
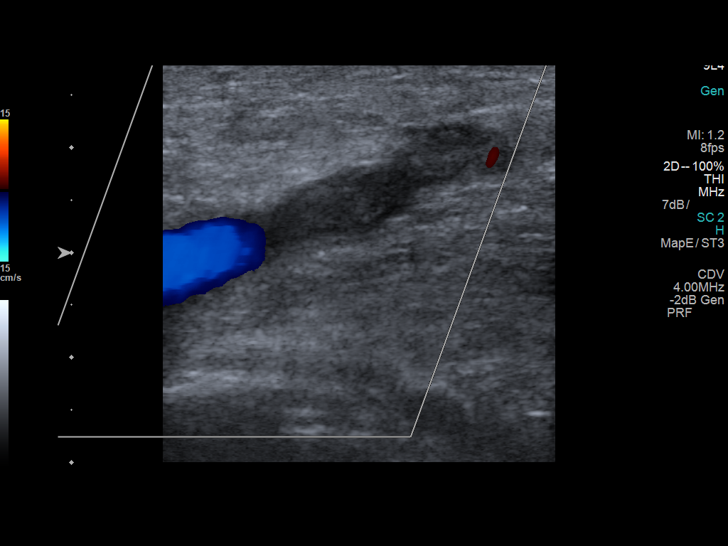
[im 14/39]
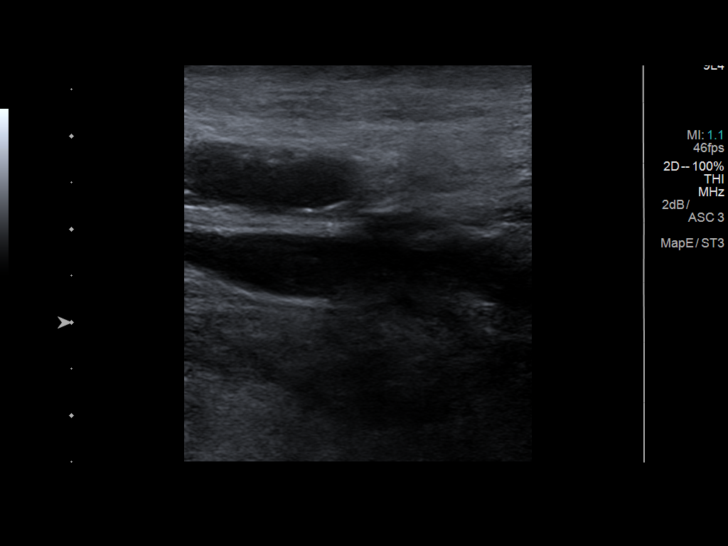
[im 17/39]
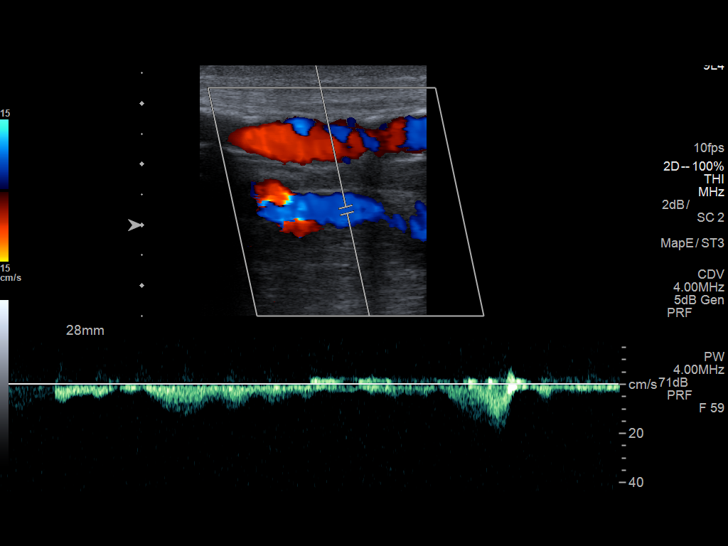
[im 20/39]
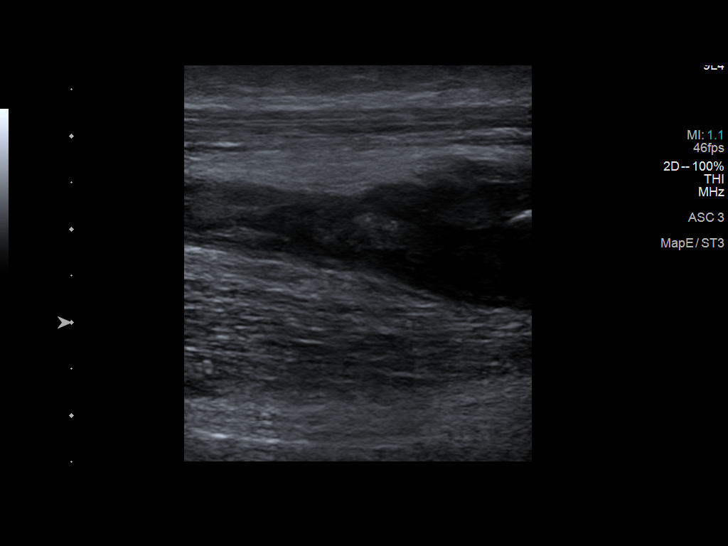
[im 22/39]
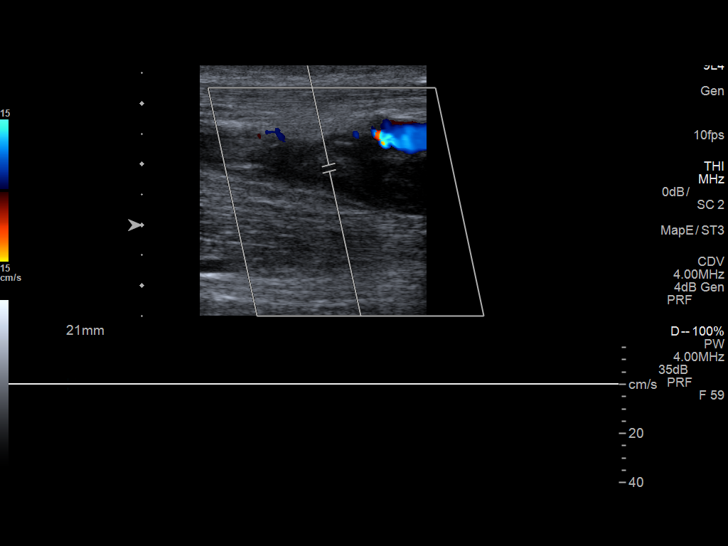
[im 25/39]
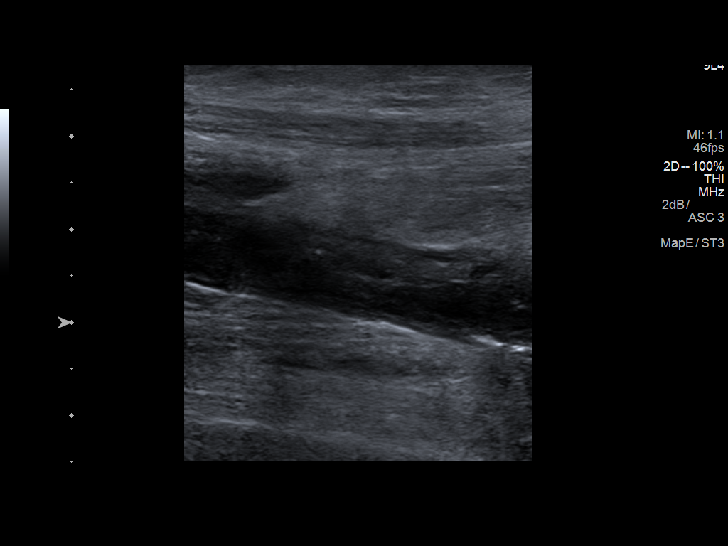
[im 29/39]
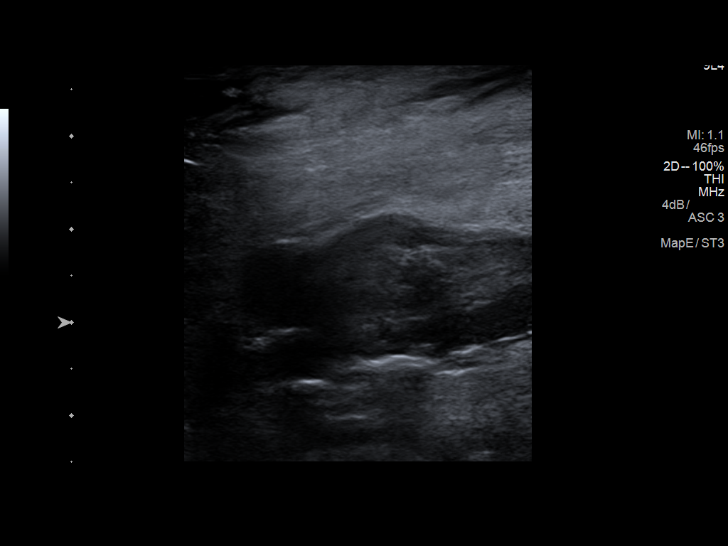
[im 32/39]
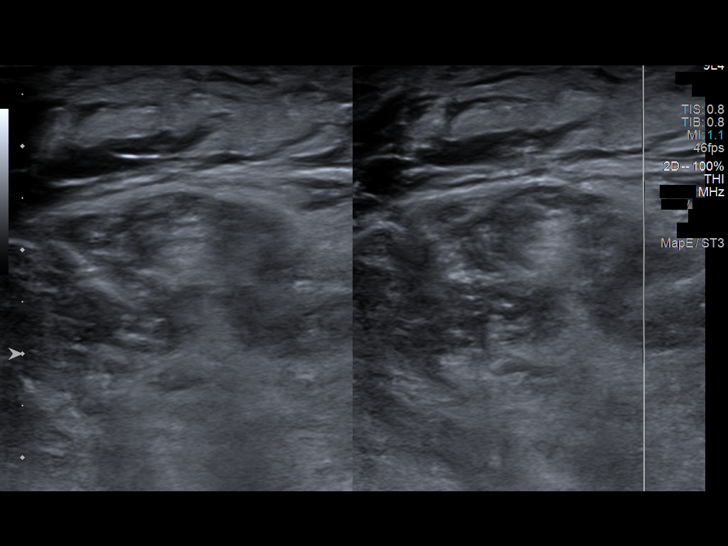
[im 35/39]
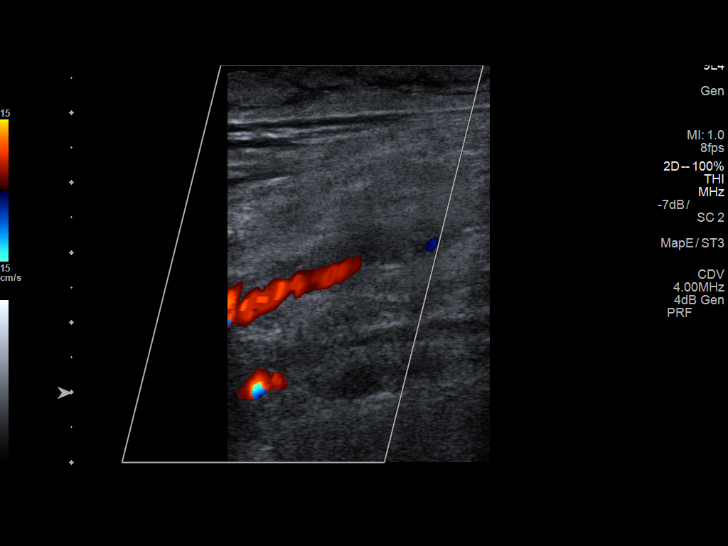
[im 39/39]
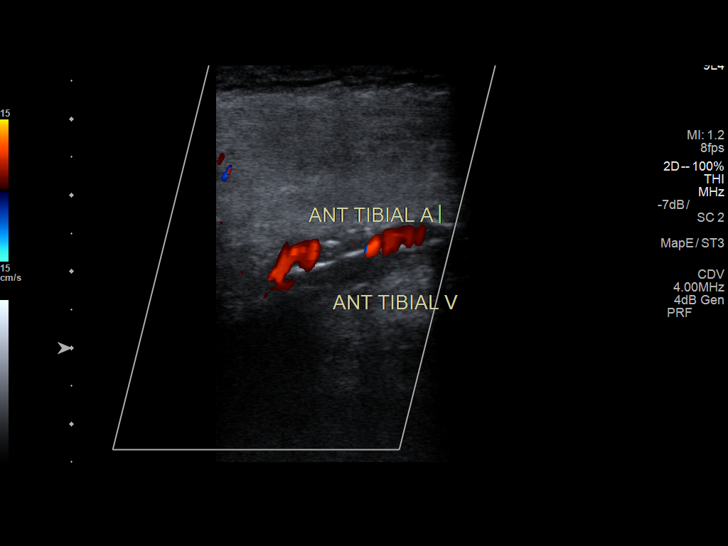

[13 of 24 positions shown; findings below may reference images not displayed]

FINDINGS: Contralateral Common Femoral Vein: Respiratory phasicity is normal
and symmetric with the symptomatic side. No evidence of thrombus.
Normal compressibility.

Common Femoral Vein: Noncompressible with partial flow consistent
with thrombosis.

Saphenofemoral Junction: Noncompressible with no flow consistent
with occlusive thrombus.

Profunda Femoral Vein: Noncompressible with partial flow consistent
with thrombosis.

Femoral Vein: Noncompressible with no flow consistent with occlusive
thrombus.

Popliteal Vein: Noncompressible with no flow consistent with
occlusive thrombus.

Calf Veins: Noncompressible with no flow consistent with occlusive
thrombus.

Venous Reflux:  None.

Other Findings:  None.
IMPRESSION: Acute deep venous thrombosis is seen involving the left common
femoral, profunda femoral, superficial femoral, popliteal and calf
veins.

## 2019-01-15 IMAGING — CT CT HEAD W/O CM
3 series · 15 of 47 positions shown, 18 images · non-contrast
Comparison: None.

CLINICAL DATA: Altered level of consciousness

EXAM:
CT HEAD WITHOUT CONTRAST
TECHNIQUE: Contiguous axial images were obtained from the base of the skull
through the vertex without intravenous contrast.

[Series 2: head wo · axial · 0.40mm/px · z∈[-50,+85]mm · 9 of 33 slices shown, 12 images]
[im 3/33  brain]
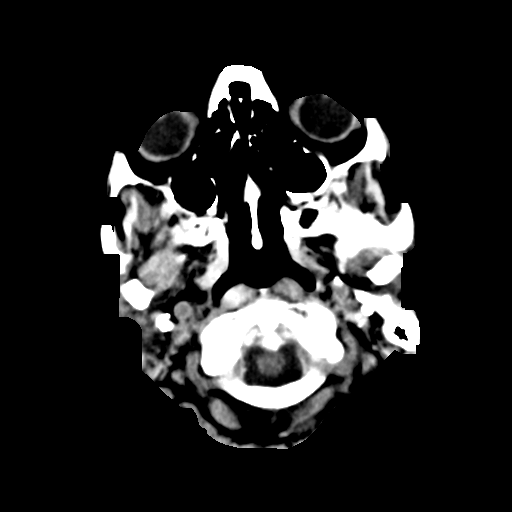
[im 3/33  bone]
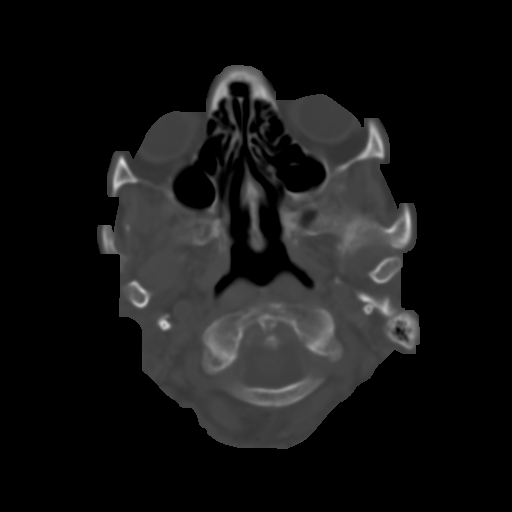
[im 6/33  brain]
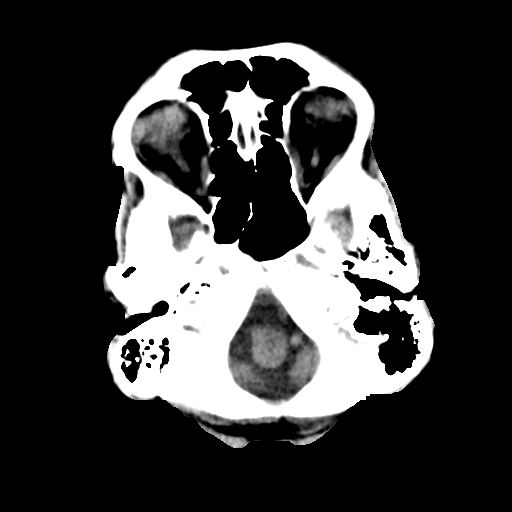
[im 9/33  brain]
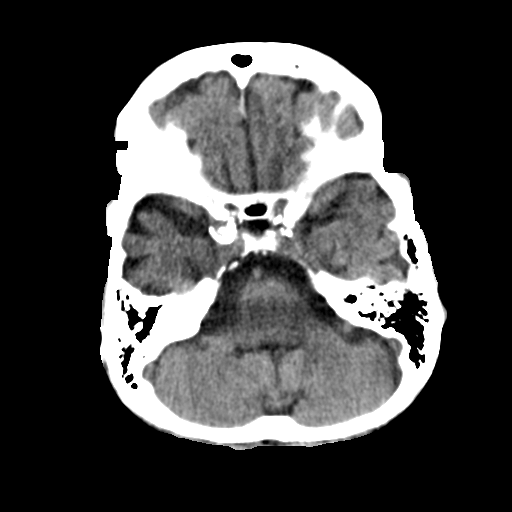
[im 13/33  brain]
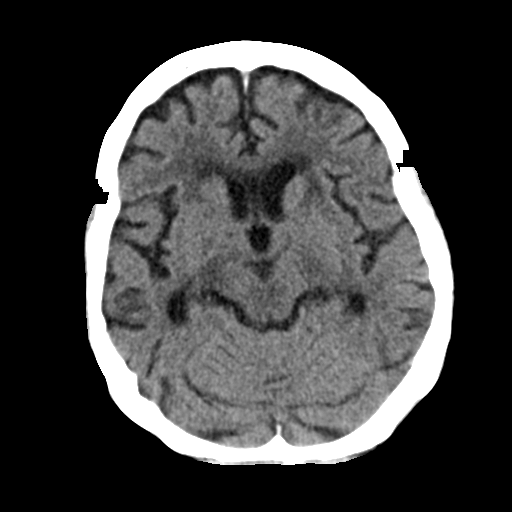
[im 17/33  brain]
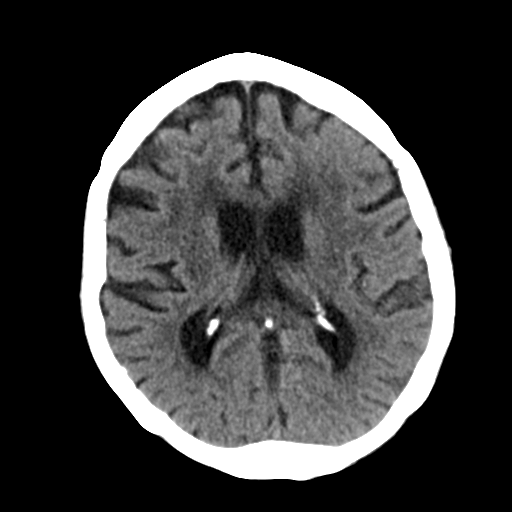
[im 17/33  bone]
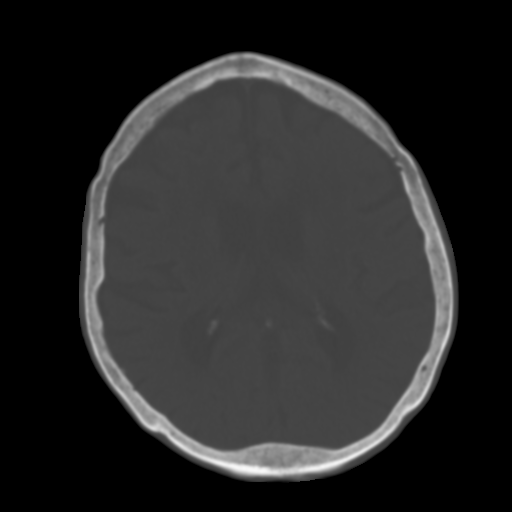
[im 20/33  brain]
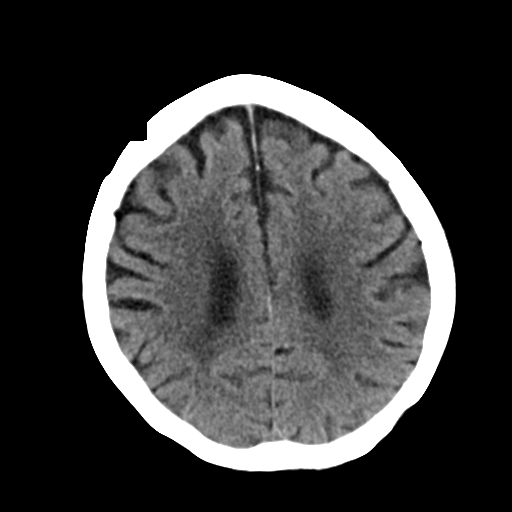
[im 24/33  brain]
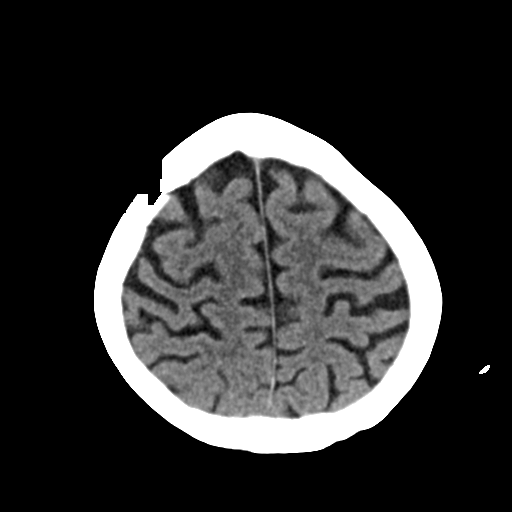
[im 27/33  brain]
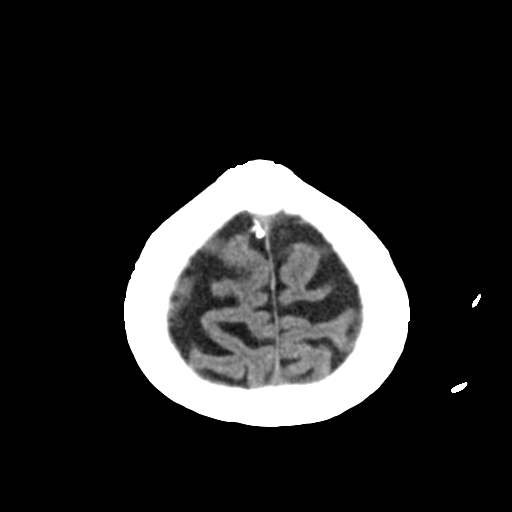
[im 30/33  brain]
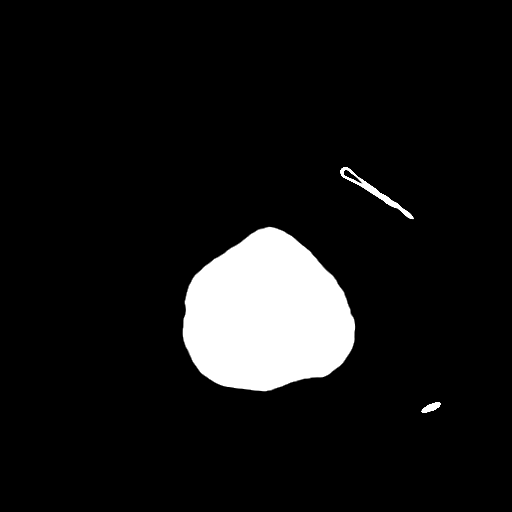
[im 30/33  bone]
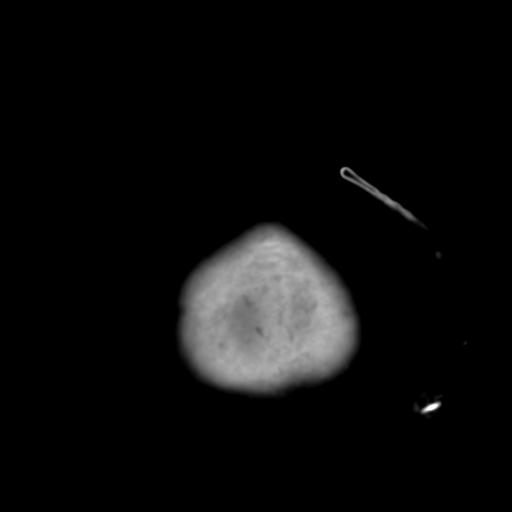

[Series 4: coronal soft tissue · coronal · 0.31mm/px · 3 of 68 slices shown]
[im 23/68  brain]
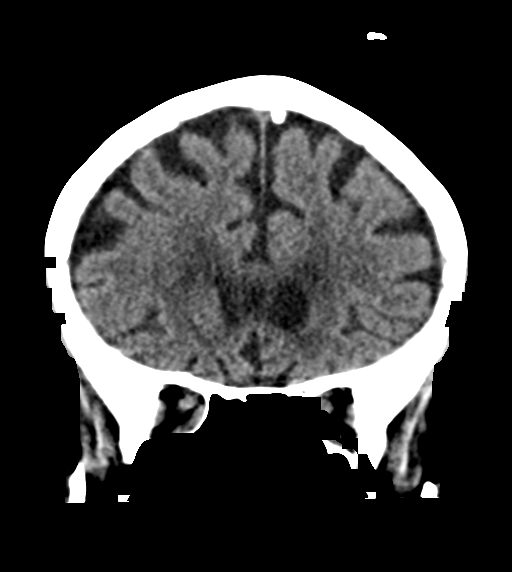
[im 30/68  brain]
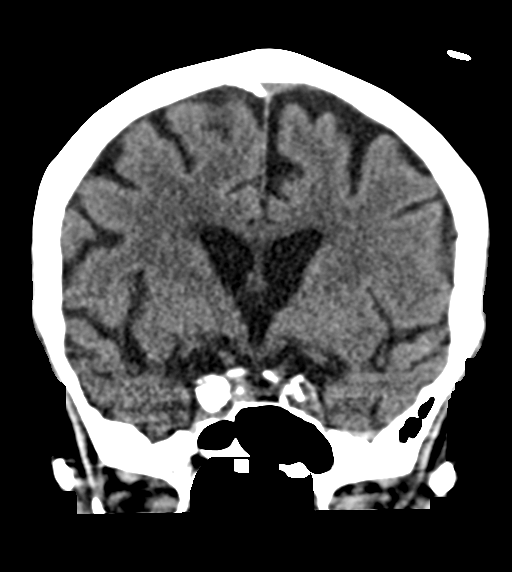
[im 38/68  brain]
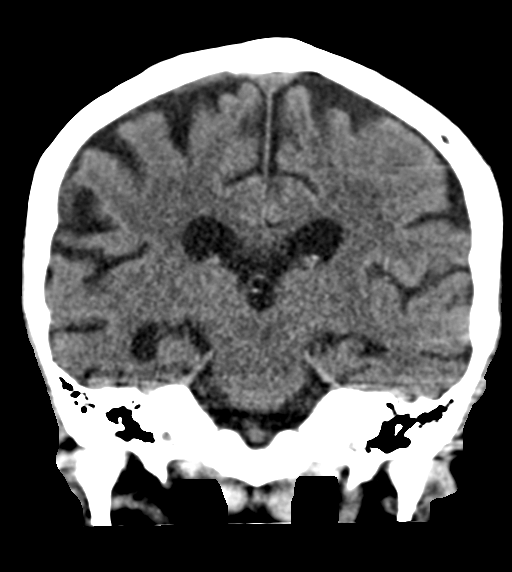

[Series 5: sagittal soft tissue · sagittal · 0.33mm/px · 3 of 67 slices shown]
[im 23/67  brain]
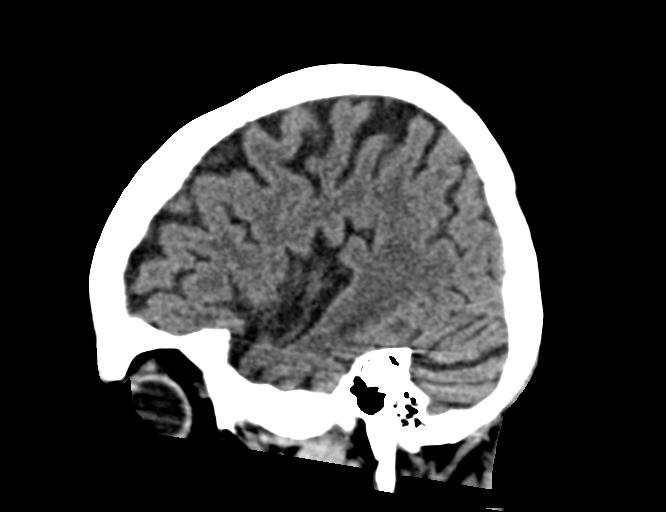
[im 34/67  brain]
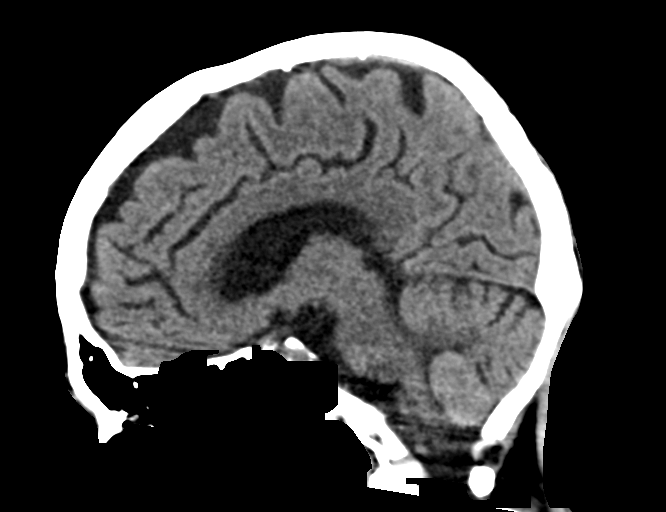
[im 45/67  brain]
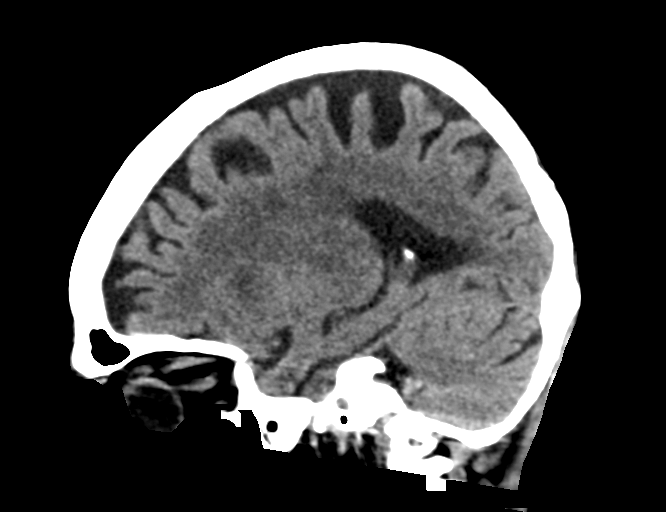

[15 of 47 positions shown; findings below may reference images not displayed]

FINDINGS: Brain: No evidence of acute infarction, hemorrhage, hydrocephalus,
extra-axial collection or mass lesion/mass effect. Cortical atrophy
asymmetric to the right, most convincing at the level of the
temporal lobe. Mild cerebellar atrophy. Mild chronic small vessel
ischemia.

Vascular: Atherosclerotic calcification. Peripherally calcified
structure along the right cavernous carotid, favor aneurysm over
tortuous vessel based on reformats

Skull: Heterogeneous density likely from osteopenia. No acute or
focal aggressive finding.

Sinuses/Orbits: Negative
IMPRESSION: 1. No acute finding.
2. Cortical atrophy asymmetrically involving the right temporal
lobe.
3. Probable 8 mm right cavernous ICA aneurysm.
